# Patient Record
Sex: Female | Born: 1970 | Race: Black or African American | Hispanic: No | Marital: Single | Smoking: Never smoker
Health system: Southern US, Community
[De-identification: ages and names within clinical notes are randomized; demographics above are authoritative.]

## PROBLEM LIST (undated history)

## (undated) DIAGNOSIS — J45909 Unspecified asthma, uncomplicated: Secondary | ICD-10-CM

## (undated) HISTORY — PX: BACK SURGERY: SHX140

## (undated) HISTORY — PX: NECK SURGERY: SHX720

---

## 2017-05-27 ENCOUNTER — Ambulatory Visit (HOSPITAL_COMMUNITY): Payer: Self-pay | Admitting: Psychiatry

## 2017-06-01 ENCOUNTER — Encounter (HOSPITAL_COMMUNITY): Payer: Self-pay | Admitting: Psychiatry

## 2017-06-01 ENCOUNTER — Encounter (INDEPENDENT_AMBULATORY_CARE_PROVIDER_SITE_OTHER): Payer: Self-pay

## 2017-06-01 ENCOUNTER — Ambulatory Visit (HOSPITAL_COMMUNITY): Payer: Self-pay | Admitting: Psychiatry

## 2017-06-01 VITALS — BP 122/74 | HR 81 | Ht 63.0 in | Wt 191.4 lb

## 2017-06-01 DIAGNOSIS — F331 Major depressive disorder, recurrent, moderate: Secondary | ICD-10-CM

## 2017-06-01 MED ORDER — DULOXETINE HCL 60 MG PO CPEP: 60.0000 mg | ORAL_CAPSULE | Freq: Every day | ORAL | 1 refills | Status: DC

## 2017-06-01 MED ORDER — TRAZODONE HCL 100 MG PO TABS: 100.0000 mg | ORAL_TABLET | Freq: Every day | ORAL | 0 refills | Status: DC

## 2017-06-01 MED ORDER — HYDROXYZINE PAMOATE 50 MG PO CAPS: 50.0000 mg | ORAL_CAPSULE | Freq: Three times a day (TID) | ORAL | 0 refills | Status: DC | PRN

## 2017-06-01 NOTE — Patient Instructions (Addendum)
We discussed that if she like to start Wellbutrin that may help her energy, concentration, attention and focus.
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
## 2017-06-13 ENCOUNTER — Other Ambulatory Visit (HOSPITAL_COMMUNITY): Payer: Self-pay | Admitting: Psychiatry

## 2017-06-16 ENCOUNTER — Ambulatory Visit (INDEPENDENT_AMBULATORY_CARE_PROVIDER_SITE_OTHER): Payer: Self-pay | Admitting: Psychiatry

## 2017-06-16 DIAGNOSIS — F331 Major depressive disorder, recurrent, moderate: Secondary | ICD-10-CM
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
## 2017-07-01 ENCOUNTER — Ambulatory Visit (HOSPITAL_COMMUNITY): Payer: Self-pay | Admitting: Psychiatry

## 2017-08-10 ENCOUNTER — Other Ambulatory Visit (HOSPITAL_COMMUNITY): Payer: Self-pay

## 2017-08-10 MED ORDER — DULOXETINE HCL 60 MG PO CPEP: 60.0000 mg | ORAL_CAPSULE | Freq: Every day | ORAL | 0 refills | Status: DC

## 2017-08-10 MED ORDER — HYDROXYZINE PAMOATE 50 MG PO CAPS: 50.0000 mg | ORAL_CAPSULE | Freq: Three times a day (TID) | ORAL | 0 refills | Status: DC | PRN

## 2017-08-10 MED ORDER — TRAZODONE HCL 100 MG PO TABS: 100.0000 mg | ORAL_TABLET | Freq: Every day | ORAL | 0 refills | Status: DC

## 2017-09-14 ENCOUNTER — Other Ambulatory Visit (HOSPITAL_COMMUNITY): Payer: Self-pay

## 2017-09-14 MED ORDER — HYDROXYZINE PAMOATE 50 MG PO CAPS: 50.0000 mg | ORAL_CAPSULE | Freq: Three times a day (TID) | ORAL | 0 refills | Status: DC | PRN

## 2017-09-14 MED ORDER — DULOXETINE HCL 60 MG PO CPEP: 60.0000 mg | ORAL_CAPSULE | Freq: Every day | ORAL | 0 refills | Status: DC

## 2017-09-14 MED ORDER — TRAZODONE HCL 100 MG PO TABS: 100.0000 mg | ORAL_TABLET | Freq: Every day | ORAL | 0 refills | Status: DC

## 2017-09-14 NOTE — Progress Notes (Signed)
Patient called for refills on medications, she had no future appointments scheduled. I made her an appointment and refilled the scripts for one month per Regan.

## 2017-10-06 ENCOUNTER — Ambulatory Visit (HOSPITAL_COMMUNITY): Payer: Self-pay | Admitting: Psychiatry

## 2017-10-12 ENCOUNTER — Other Ambulatory Visit (HOSPITAL_COMMUNITY): Payer: Self-pay

## 2017-10-12 MED ORDER — TRAZODONE HCL 100 MG PO TABS: 100.0000 mg | ORAL_TABLET | Freq: Every day | ORAL | 0 refills | Status: DC

## 2017-10-12 MED ORDER — DULOXETINE HCL 60 MG PO CPEP: 60.0000 mg | ORAL_CAPSULE | Freq: Every day | ORAL | 0 refills | Status: DC

## 2017-10-12 NOTE — Progress Notes (Signed)
Sent in 30 day refill, per Regan. Appointment is 10/25/17

## 2017-10-12 NOTE — Progress Notes (Signed)
Sent in 30 day refill, Per Regan. Appointment is 10/15/17

## 2017-10-15 ENCOUNTER — Ambulatory Visit (INDEPENDENT_AMBULATORY_CARE_PROVIDER_SITE_OTHER): Payer: Self-pay | Admitting: Psychiatry

## 2017-10-15 ENCOUNTER — Encounter (HOSPITAL_COMMUNITY): Payer: Self-pay | Admitting: Psychiatry

## 2017-10-15 VITALS — BP 132/74 | HR 78 | Ht 64.0 in | Wt 192.0 lb

## 2017-10-15 DIAGNOSIS — Z915 Personal history of self-harm: Secondary | ICD-10-CM

## 2017-10-15 DIAGNOSIS — G4700 Insomnia, unspecified: Secondary | ICD-10-CM

## 2017-10-15 DIAGNOSIS — M549 Dorsalgia, unspecified: Secondary | ICD-10-CM

## 2017-10-15 DIAGNOSIS — F419 Anxiety disorder, unspecified: Secondary | ICD-10-CM

## 2017-10-15 DIAGNOSIS — M2550 Pain in unspecified joint: Secondary | ICD-10-CM

## 2017-10-15 DIAGNOSIS — R450 Nervousness: Secondary | ICD-10-CM

## 2017-10-15 DIAGNOSIS — F331 Major depressive disorder, recurrent, moderate: Secondary | ICD-10-CM

## 2017-10-15 MED ORDER — CLONAZEPAM 0.5 MG PO TABS: 0.5000 mg | ORAL_TABLET | Freq: Every day | ORAL | 0 refills | Status: DC

## 2017-10-15 MED ORDER — HYDROXYZINE PAMOATE 50 MG PO CAPS: 50.0000 mg | ORAL_CAPSULE | Freq: Every day | ORAL | 0 refills | Status: DC | PRN

## 2017-10-15 MED ORDER — BUPROPION HCL ER (XL) 150 MG PO TB24: 150.0000 mg | ORAL_TABLET | Freq: Every day | ORAL | 0 refills | Status: DC

## 2017-10-15 MED ORDER — HYDROXYZINE PAMOATE 50 MG PO CAPS
50.0000 mg | ORAL_CAPSULE | Freq: Every day | ORAL | 0 refills | Status: DC | PRN
Start: 2017-10-15 — End: 2018-10-05

## 2017-10-15 MED ORDER — BUPROPION HCL ER (XL) 150 MG PO TB24
150.0000 mg | ORAL_TABLET | Freq: Every day | ORAL | 0 refills | Status: DC
Start: 2017-10-15 — End: 2017-11-04

## 2017-10-15 NOTE — Progress Notes (Signed)
BH MD/PA/NP OP Progress Note  10/15/2017 3:36 PM Kimberly Harmon  MRN:  [id number]  Chief Complaint: I ran out from Cymbalta 2 days ago.  I want to try a different medication.  HPI: Patient came for her follow-up appointment.  She is a 47 year old divorced unemployed female who was seen first time 3 months ago.  Patient apologized missing her appointment.  She moved from [place] to live closer to her sister.  Patient was admitted in October 2018 in [place] due to severe depression and having suicidal thoughts.  She was discharged on Remeron and Cymbalta.  We have discontinue Remeron because patient was not sleeping good and we try trazodone.  Patient do not see any improvement with the trazodone and continued to have insomnia, racing thoughts and depressive symptoms.  She felt the Cymbalta not working.  She is been out of Cymbalta for 2 days and like to try a different medication.  She lives with her sister but she admitted she need to find a separate place for her because some time environment is not good.  Patient told her sister fights with her husband a lot and that does not help her.  Patient is a 47 year old daughter who lives in [place].  Patient is noncompliant with therapist.  Patient admitted chronic feeling of unhappiness, guilt, regret, fatigue and sadness.  Sometimes she has difficulty focus and attention.  She used to take Adderall which was given by primary care physician to help focus and attention but discontinued when she was admitted in the hospital.  Currently patient is not in the school or doing any work.  She admitted lack of motivation and desire to do things.  Her appetite is okay.  Her vital signs are stable.  Patient has back surgery and she has backache.  In the past she had tried Klonopin which helped her sleep.  Patient denies drinking alcohol or using any illegal substances.  Visit Diagnosis:    ICD-10-CM   1. MDD (major depressive disorder), recurrent episode, moderate  (HCC) F33.1 clonazePAM (KLONOPIN) 0.5 MG tablet    buPROPion (WELLBUTRIN XL) 150 MG 24 hr tablet    hydrOXYzine (VISTARIL) 50 MG capsule    DISCONTINUED: buPROPion (WELLBUTRIN XL) 150 MG 24 hr tablet    DISCONTINUED: hydrOXYzine (VISTARIL) 50 MG capsule    Past Psychiatric History: Reviewed Patient has a history of depression and sadness most of her life.  She remember taking extra pill at age 47 and then age 47 when she was not happy.  Patient was [place] which was discontinued when she was admitted at [facility] in October 2018.  Patient had tried Cymbalta, Remeron when she discharged from the hospital.  We try trazodone which did not work.  Patient denies any history of mania, psychosis or any hallucination.  Past Medical History: No past medical history on file.  Past Surgical History:  Procedure Laterality Date  . BACK SURGERY    . NECK SURGERY      Family Psychiatric History: Reviewed  Family History:  Family History  Problem Relation Age of Onset  . Depression Sister     Social History:  Social History   Socioeconomic History  . Marital status: Single    Spouse name: Not on file  . Number of children: Not on file  . Years of education: Not on file  . Highest education level: Not on file  Occupational History  . Not on file  Social Needs  . Financial resource strain: Not on file  . Food insecurity:    Worry: Not on file    Inability: Not on file  . Transportation needs:    Medical: Not on file    Non-medical: Not on file  Tobacco Use  . Smoking status: Never Smoker  . Smokeless tobacco: Never Used  Substance and Sexual Activity  . Alcohol use: No    Frequency: Never  . Drug use: No  . Sexual activity: Not on file  Lifestyle  . Physical activity:    Days per week: Not on file    Minutes per session: Not on file  . Stress: Not on file  Relationships  . Social connections:    Talks on  phone: Not on file    Gets together: Not on file    Attends religious service: Not on file    Active member of club or organization: Not on file    Attends meetings of clubs or organizations: Not on file    Relationship status: Not on file  Other Topics Concern  . Not on file  Social History Narrative  . Not on file    Allergies: Not on File  Metabolic Disorder Labs: No results found for: HGBA1C, MPG No results found for: PROLACTIN No results found for: CHOL, TRIG, HDL, CHOLHDL, VLDL, LDLCALC No results found for: TSH  Therapeutic Level Labs: No results found for: LITHIUM No results found for: VALPROATE No components found for:  CBMZ  Current Medications: Current Outpatient Medications  Medication Sig Dispense Refill  . DULoxetine (CYMBALTA) 60 MG capsule Take 1 capsule (60 mg total) by mouth daily. 30 capsule 0  . hydrOXYzine (VISTARIL) 50 MG capsule Take 1 capsule (50 mg total) by mouth 3 (three) times daily as needed. 30 capsule 0  . methocarbamol (ROBAXIN) 500 MG tablet Take 1,000 mg daily as needed by mouth.    . traMADol (ULTRAM) 50 MG tablet Take every 6 (six) hours as needed by mouth.    . traZODone (DESYREL) 100 MG tablet Take 1 tablet (100 mg total) by mouth at bedtime. 30 tablet 0   No current facility-administered medications for this visit.      Musculoskeletal: Strength & Muscle Tone: within normal limits Gait & Station: normal Patient leans: N/A  Psychiatric Specialty Exam: Review of Systems  Constitutional: Negative.   Musculoskeletal: Positive for back pain and joint pain.  Skin: Negative.   Psychiatric/Behavioral: Positive for depression. The patient is nervous/anxious and has insomnia.     Blood pressure 132/74, pulse 78, height 5\' 4"  (1.626 m), weight 192 lb (87.1 kg).There is no height or weight on file to calculate BMI.  General Appearance: Casual  Eye Contact:  Fair  Speech:  Slow  Volume:  Normal  Mood:  Anxious  Affect:  Congruent   Thought Process:  Goal Directed  Orientation:  Full (Time, Place, and Person)  Thought Content: Rumination   Suicidal Thoughts:  No  Homicidal Thoughts:  No  Memory:  Immediate;   Good Recent;   Good Remote;   Good  Judgement:  Good  Insight:  Good  Psychomotor Activity:  Decreased  Concentration:  Concentration: Fair and Attention Span: Fair  Recall:  Good  Fund of Knowledge: Good  Language: Good  Akathisia:  No  Handed:  Right  AIMS (if indicated): not done  Assets:  Communication Skills Desire for Improvement Housing Resilience Social Support  ADL's:  Intact  Cognition: WNL  Sleep:  Fair   Screenings:   Assessment and Plan: Major depressive disorder, recurrent.  Anxiety disorder NOS.  We are still awaiting records from [place].  I will discontinue Cymbalta as patient do not see any improvement.  I will also discontinue trazodone because patient continues to have insomnia.  Recommended to try Wellbutrin XL 150 mg to help focus attention and depression and anxiety symptoms.  I would also add low-dose Klonopin to help sleep and anxiety symptoms.  Discussed benzodiazepine dependence, tolerance and withdrawal.  Patient is not interested in counseling.  She is hoping to move back to [place] because she does not like here.  Discussed safety concerns at any time having active suicidal thoughts or homicidal thought and she need to call 911 or go to local emergency room.  Follow-up in 4 weeks.  Time spent 25 minutes.  More than 50% of the time spent in psychoeducation, counseling and coordination of care.   Cleotis Nipper, MD 10/15/2017, 3:36 PM

## 2017-10-21 ENCOUNTER — Telehealth (HOSPITAL_COMMUNITY): Payer: Self-pay

## 2017-10-21 NOTE — Telephone Encounter (Signed)
Patient is calling to let you know that she is doing great on the Wellbutrin, she has a ton of energy and is feeling great. She said that now she is having trouble going to sleep. She loves the energy, but wants to know if you can increase the Klonopin to 1 mg at bedtime. Please review and advise, thank you

## 2017-10-21 NOTE — Telephone Encounter (Signed)
She should try taking melatonin first.  She already taking Vistaril and Klonopin for insomnia.

## 2017-10-22 NOTE — Telephone Encounter (Signed)
I spoke to the patient and she is willing to try the melatonin. She will call me next week and let me know how she is doing

## 2017-11-04 ENCOUNTER — Other Ambulatory Visit (HOSPITAL_COMMUNITY): Payer: Self-pay

## 2017-11-04 DIAGNOSIS — F331 Major depressive disorder, recurrent, moderate: Secondary | ICD-10-CM

## 2017-11-04 MED ORDER — CLONAZEPAM 0.5 MG PO TABS: 0.5000 mg | ORAL_TABLET | Freq: Every day | ORAL | 0 refills | Status: DC

## 2017-11-04 MED ORDER — BUPROPION HCL ER (XL) 150 MG PO TB24: 150.0000 mg | ORAL_TABLET | Freq: Every day | ORAL | 0 refills | Status: DC

## 2017-12-15 ENCOUNTER — Ambulatory Visit (HOSPITAL_COMMUNITY): Payer: Self-pay | Admitting: Psychiatry

## 2018-01-25 ENCOUNTER — Other Ambulatory Visit: Payer: Self-pay

## 2018-01-25 ENCOUNTER — Emergency Department (HOSPITAL_BASED_OUTPATIENT_CLINIC_OR_DEPARTMENT_OTHER)
Admission: EM | Admit: 2018-01-25 | Discharge: 2018-01-25 | Disposition: A | Discharge status: Home / Self Care | Payer: Self-pay | Attending: Emergency Medicine | Admitting: Emergency Medicine

## 2018-01-25 ENCOUNTER — Encounter (HOSPITAL_BASED_OUTPATIENT_CLINIC_OR_DEPARTMENT_OTHER): Payer: Self-pay | Admitting: *Deleted

## 2018-01-25 DIAGNOSIS — Z79899 Other long term (current) drug therapy: Secondary | ICD-10-CM | POA: Insufficient documentation

## 2018-01-25 DIAGNOSIS — J45901 Unspecified asthma with (acute) exacerbation: Secondary | ICD-10-CM

## 2018-01-25 DIAGNOSIS — J4541 Moderate persistent asthma with (acute) exacerbation: Secondary | ICD-10-CM | POA: Insufficient documentation

## 2018-01-25 MED ORDER — ALBUTEROL SULFATE HFA 108 (90 BASE) MCG/ACT IN AERS
2.0000 | INHALATION_SPRAY | Freq: Once | RESPIRATORY_TRACT | Status: AC
  Administered 2018-01-25: 2 via RESPIRATORY_TRACT
  Filled 2018-01-25: qty 6.7

## 2018-01-25 MED ORDER — ALBUTEROL SULFATE (2.5 MG/3ML) 0.083% IN NEBU
2.5000 mg | INHALATION_SOLUTION | Freq: Once | RESPIRATORY_TRACT | Status: AC
  Administered 2018-01-25: 2.5 mg via RESPIRATORY_TRACT
  Filled 2018-01-25: qty 3

## 2018-01-25 MED ORDER — ALBUTEROL SULFATE (2.5 MG/3ML) 0.083% IN NEBU: 2.5000 mg | INHALATION_SOLUTION | RESPIRATORY_TRACT | 12 refills | Status: DC | PRN

## 2018-01-25 MED ORDER — IPRATROPIUM-ALBUTEROL 0.5-2.5 (3) MG/3ML IN SOLN
3.0000 mL | Freq: Once | RESPIRATORY_TRACT | Status: AC
  Administered 2018-01-25: 3 mL via RESPIRATORY_TRACT
  Filled 2018-01-25: qty 3

## 2018-01-25 MED ORDER — ALBUTEROL SULFATE HFA 108 (90 BASE) MCG/ACT IN AERS: 1.0000 | INHALATION_SPRAY | RESPIRATORY_TRACT | 2 refills | Status: AC | PRN

## 2018-01-25 MED ORDER — ALBUTEROL SULFATE (2.5 MG/3ML) 0.083% IN NEBU
5.0000 mg | INHALATION_SOLUTION | Freq: Once | RESPIRATORY_TRACT | Status: AC
  Administered 2018-01-25: 5 mg via RESPIRATORY_TRACT
  Filled 2018-01-25: qty 6

## 2018-01-25 MED ORDER — PREDNISONE 20 MG PO TABS: 40.0000 mg | ORAL_TABLET | Freq: Every day | ORAL | 0 refills | Status: DC

## 2018-01-25 MED ORDER — PREDNISONE 20 MG PO TABS
40.0000 mg | ORAL_TABLET | Freq: Once | ORAL | Status: AC
  Administered 2018-01-25: 40 mg via ORAL
  Filled 2018-01-25: qty 2

## 2018-01-25 NOTE — ED Triage Notes (Signed)
Cough x 3 weeks. Hx of asthma. She has been using her inhaler and taking OTC cough meds with no relief. She feels like she has a sinus infection.

## 2018-01-26 ENCOUNTER — Telehealth (HOSPITAL_COMMUNITY): Payer: Self-pay

## 2018-01-26 ENCOUNTER — Other Ambulatory Visit (HOSPITAL_COMMUNITY): Payer: Self-pay | Admitting: Psychiatry

## 2018-01-26 DIAGNOSIS — F331 Major depressive disorder, recurrent, moderate: Secondary | ICD-10-CM

## 2018-01-26 NOTE — Telephone Encounter (Signed)
Thanks

## 2018-01-26 NOTE — Telephone Encounter (Signed)
Patient is requesting a refill on Wellbutrin 150mg  and Clonazepam 0.5mg . Pharmacy is [facility] [address]. Please advise

## 2018-01-26 NOTE — Telephone Encounter (Signed)
She was given a 90-day supply both medication on April 10.  She has appointment on July 9.

## 2018-02-01 ENCOUNTER — Other Ambulatory Visit (HOSPITAL_COMMUNITY): Payer: Self-pay | Admitting: Psychiatry

## 2018-02-01 DIAGNOSIS — F331 Major depressive disorder, recurrent, moderate: Secondary | ICD-10-CM

## 2018-02-01 NOTE — ED Provider Notes (Signed)
[facility] EMERGENCY DEPARTMENT Provider Note   CSN: [id number] Arrival date & time: 01/25/18  1828     History   Chief Complaint Chief Complaint  Patient presents with  . Cough    HPI Kimberly Harmon is a 47 y.o. female.  HPI   [name] with cough and wheezing. Persistent for past 3 w. No fever. Cough nonproductive. Hx of asthma and feels similar to prior exacerbations. Also congestion and mild facial pressure. She feels like she may have sinusitis. No unusual leg pain or swelling.   History reviewed. No pertinent past medical history.  There are no active problems to display for this patient.   Past Surgical History:  Procedure Laterality Date  . BACK SURGERY    . NECK SURGERY       OB History   None      Home Medications    Prior to Admission medications   Medication Sig Start Date End Date Taking? Authorizing Provider  buPROPion (WELLBUTRIN XL) 150 MG 24 hr tablet Take 1 tablet (150 mg total) by mouth daily. 11/04/17  Yes Arfeen, Phillips Grout, MD  clonazePAM (KLONOPIN) 0.5 MG tablet Take 1 tablet (0.5 mg total) by mouth at bedtime. 11/04/17 11/04/18 Yes Arfeen, Phillips Grout, MD  albuterol (PROVENTIL HFA;VENTOLIN HFA) 108 (90 Base) MCG/ACT inhaler Inhale 1-2 puffs into the lungs every 4 (four) hours as needed for wheezing or shortness of breath. 01/25/18   Raeford Razor, MD  albuterol (PROVENTIL) (2.5 MG/3ML) 0.083% nebulizer solution Take 3 mLs (2.5 mg total) by nebulization every 4 (four) hours as needed for wheezing or shortness of breath. 01/25/18   Raeford Razor, MD  hydrOXYzine (VISTARIL) 50 MG capsule Take 1 capsule (50 mg total) by mouth daily as needed for anxiety. 10/15/17   Arfeen, Phillips Grout, MD  methocarbamol (ROBAXIN) 500 MG tablet Take 1,000 mg daily as needed by mouth.    [provider]  predniSONE (DELTASONE) 20 MG tablet Take 2 tablets (40 mg total) by mouth daily. 01/25/18   Raeford Razor, MD  traMADol (ULTRAM) 50 MG tablet Take every 6 (six) hours as  needed by mouth.    [provider]    Family History Family History  Problem Relation Age of Onset  . Depression Sister     Social History Social History   Tobacco Use  . Smoking status: Never Smoker  . Smokeless tobacco: Never Used  Substance Use Topics  . Alcohol use: No    Frequency: Never  . Drug use: No     Allergies   Patient has no known allergies.   Review of Systems Review of Systems  All systems reviewed and negative, other than as noted in HPI.   Physical Exam Updated Vital Signs BP (!) 136/96   Pulse 89   Temp 97.9 F (36.6 C) (Oral)   Resp 18   Ht 5\' 3"  (1.6 m)   Wt 86.2 kg (190 lb)   LMP 01/25/2018   SpO2 96%   BMI 33.66 kg/m   Physical Exam  Constitutional: She appears well-developed and well-nourished. No distress.  HENT:  Head: Normocephalic and atraumatic.  Eyes: Conjunctivae are normal. Right eye exhibits no discharge. Left eye exhibits no discharge.  Neck: Neck supple.  Cardiovascular: Normal rate, regular rhythm and normal heart sounds. Exam reveals no gallop and no friction rub.  No murmur heard. Pulmonary/Chest: Effort normal. No respiratory distress. She has wheezes.  Abdominal: Soft. She exhibits no distension. There is no tenderness.  Musculoskeletal: She exhibits no edema or tenderness.  Lower extremities symmetric as compared to each other. No calf tenderness. Negative Homan's. No palpable cords.   Neurological: She is alert.  Skin: Skin is warm and dry.  Psychiatric: She has a normal mood and affect. Her behavior is normal. Thought content normal.  Nursing note and vitals reviewed.    ED Treatments / Results  Labs (all labs ordered are listed, but only abnormal results are displayed) Labs Reviewed - No data to display  EKG None  Radiology No results found.   Procedures Procedures (including critical care time)  Medications Ordered in ED Medications  ipratropium-albuterol (DUONEB) 0.5-2.5 (3)  MG/3ML nebulizer solution 3 mL (3 mLs Nebulization Given 01/25/18 1852)  albuterol (PROVENTIL) (2.5 MG/3ML) 0.083% nebulizer solution 2.5 mg (2.5 mg Nebulization Given 01/25/18 1852)  predniSONE (DELTASONE) tablet 40 mg (40 mg Oral Given 01/25/18 1858)  albuterol (PROVENTIL) (2.5 MG/3ML) 0.083% nebulizer solution 5 mg (5 mg Nebulization Given 01/25/18 1919)  albuterol (PROVENTIL HFA;VENTOLIN HFA) 108 (90 Base) MCG/ACT inhaler 2 puff (2 puffs Inhalation Given 01/25/18 1946)     Initial Impression / Assessment and Plan / ED Course  I have reviewed the triage vital signs and the nursing notes.  Pertinent labs & imaging results that were available during my care of the patient were reviewed by me and considered in my medical decision making (see chart for details).     [name] with cough/dyspnea. Wheezing on exam. Afebrile. Nontoxic. o2 sats adequate on RA. Seems reasonably comfortable. Feeling better after nebs. I doubt pneumonia, PE or other emergent process. Plan steroids. Refills for albuterol provided.   Final Clinical Impressions(s) / ED Diagnoses   Final diagnoses:  Moderate asthma with exacerbation, unspecified whether persistent    ED Discharge Orders        Ordered    albuterol (PROVENTIL HFA;VENTOLIN HFA) 108 (90 Base) MCG/ACT inhaler  Every 4 hours PRN     01/25/18 1938    albuterol (PROVENTIL) (2.5 MG/3ML) 0.083% nebulizer solution  Every 4 hours PRN     01/25/18 1938    predniSONE (DELTASONE) 20 MG tablet  Daily     01/25/18 1938       Raeford RazorKohut, Keyvon Herter, MD 02/01/18 [phone]

## 2018-02-02 ENCOUNTER — Other Ambulatory Visit (HOSPITAL_COMMUNITY): Payer: Self-pay

## 2018-02-02 ENCOUNTER — Ambulatory Visit (HOSPITAL_COMMUNITY): Payer: Self-pay | Admitting: Psychiatry

## 2018-02-02 DIAGNOSIS — F331 Major depressive disorder, recurrent, moderate: Secondary | ICD-10-CM

## 2018-02-02 MED ORDER — BUPROPION HCL ER (XL) 150 MG PO TB24: 150.0000 mg | ORAL_TABLET | Freq: Every day | ORAL | 0 refills | Status: DC

## 2018-02-02 MED ORDER — CLONAZEPAM 0.5 MG PO TABS: 0.5000 mg | ORAL_TABLET | Freq: Every day | ORAL | 0 refills | Status: DC

## 2018-04-16 ENCOUNTER — Other Ambulatory Visit: Payer: Self-pay

## 2018-04-16 ENCOUNTER — Emergency Department (HOSPITAL_BASED_OUTPATIENT_CLINIC_OR_DEPARTMENT_OTHER)
Admission: EM | Admit: 2018-04-16 | Discharge: 2018-04-16 | Disposition: A | Discharge status: Home / Self Care | Payer: Self-pay | Attending: Emergency Medicine | Admitting: Emergency Medicine

## 2018-04-16 ENCOUNTER — Emergency Department (HOSPITAL_BASED_OUTPATIENT_CLINIC_OR_DEPARTMENT_OTHER): Payer: Self-pay

## 2018-04-16 ENCOUNTER — Encounter (HOSPITAL_BASED_OUTPATIENT_CLINIC_OR_DEPARTMENT_OTHER): Payer: Self-pay

## 2018-04-16 DIAGNOSIS — J0100 Acute maxillary sinusitis, unspecified: Secondary | ICD-10-CM | POA: Insufficient documentation

## 2018-04-16 DIAGNOSIS — J90 Pleural effusion, not elsewhere classified: Secondary | ICD-10-CM | POA: Insufficient documentation

## 2018-04-16 DIAGNOSIS — Z79899 Other long term (current) drug therapy: Secondary | ICD-10-CM | POA: Insufficient documentation

## 2018-04-16 DIAGNOSIS — J4521 Mild intermittent asthma with (acute) exacerbation: Secondary | ICD-10-CM | POA: Insufficient documentation

## 2018-04-16 HISTORY — DX: Unspecified asthma, uncomplicated: J45.909

## 2018-04-16 MED ORDER — GUAIFENESIN ER 1200 MG PO TB12: 1.0000 | ORAL_TABLET | Freq: Two times a day (BID) | ORAL | 1 refills | Status: AC | PRN

## 2018-04-16 MED ORDER — GUAIFENESIN ER 1200 MG PO TB12: 1.0000 | ORAL_TABLET | Freq: Two times a day (BID) | ORAL | 0 refills | Status: AC | PRN

## 2018-04-16 MED ORDER — BENZONATATE 100 MG PO CAPS
100.0000 mg | ORAL_CAPSULE | Freq: Once | ORAL | Status: AC
  Administered 2018-04-16: 100 mg via ORAL
  Filled 2018-04-16: qty 1

## 2018-04-16 MED ORDER — ALBUTEROL SULFATE (2.5 MG/3ML) 0.083% IN NEBU
2.5000 mg | INHALATION_SOLUTION | Freq: Once | RESPIRATORY_TRACT | Status: AC
  Administered 2018-04-16: 2.5 mg via RESPIRATORY_TRACT

## 2018-04-16 MED ORDER — DOXYCYCLINE HYCLATE 100 MG PO CAPS: 100.0000 mg | ORAL_CAPSULE | Freq: Two times a day (BID) | ORAL | 0 refills | Status: AC

## 2018-04-16 MED ORDER — PREDNISONE 10 MG PO TABS: 40.0000 mg | ORAL_TABLET | Freq: Every day | ORAL | 0 refills | Status: DC

## 2018-04-16 MED ORDER — PREDNISONE 10 MG PO TABS
60.0000 mg | ORAL_TABLET | Freq: Once | ORAL | Status: AC
  Administered 2018-04-16: 60 mg via ORAL
  Filled 2018-04-16: qty 1

## 2018-04-16 MED ORDER — IPRATROPIUM BROMIDE 0.02 % IN SOLN
0.5000 mg | Freq: Once | RESPIRATORY_TRACT | Status: AC
  Administered 2018-04-16: 0.5 mg via RESPIRATORY_TRACT
  Filled 2018-04-16: qty 2.5

## 2018-04-16 MED ORDER — ALBUTEROL SULFATE HFA 108 (90 BASE) MCG/ACT IN AERS
1.0000 | INHALATION_SPRAY | Freq: Once | RESPIRATORY_TRACT | Status: AC
  Administered 2018-04-16: 1 via RESPIRATORY_TRACT
  Filled 2018-04-16: qty 6.7

## 2018-04-16 MED ORDER — DOXYCYCLINE HYCLATE 100 MG PO TABS
100.0000 mg | ORAL_TABLET | Freq: Once | ORAL | Status: AC
  Administered 2018-04-16: 100 mg via ORAL
  Filled 2018-04-16: qty 1

## 2018-04-16 MED ORDER — ALBUTEROL (5 MG/ML) CONTINUOUS INHALATION SOLN
10.0000 mg/h | INHALATION_SOLUTION | RESPIRATORY_TRACT | Status: DC
  Administered 2018-04-16: 10 mg/h via RESPIRATORY_TRACT
  Filled 2018-04-16: qty 20

## 2018-04-16 MED ORDER — BENZONATATE 100 MG PO CAPS: 100.0000 mg | ORAL_CAPSULE | Freq: Three times a day (TID) | ORAL | 0 refills | Status: DC

## 2018-04-16 MED ORDER — IPRATROPIUM-ALBUTEROL 0.5-2.5 (3) MG/3ML IN SOLN
3.0000 mL | Freq: Once | RESPIRATORY_TRACT | Status: AC
  Administered 2018-04-16: 3 mL via RESPIRATORY_TRACT

## 2018-04-16 NOTE — ED Notes (Signed)
ED Provider at bedside. 

## 2018-04-16 NOTE — ED Provider Notes (Signed)
Medical screening examination/treatment/procedure(s) were conducted as a shared visit with non-physician practitioner(s) and myself.  I personally evaluated the patient during the encounter.  47 year old female history of asthma the presents with a week and half of progressively worsening shortness of breath, postnasal drip, left sinus pain.  Last couple days got significantly worse to the point where she felt like she could barely breathing her inhaler was now but also she came here for further evaluation.  On my evaluation patient is Artie had quite a few breathing treatments and prednisone and she states she feels much better.  She still diminished with expiratory wheezing but she appears comfortable.  She is slightly tachypneic but not in any respiratory distress.  Patient does have a little bit left maxillary sinus tenderness to palpation and some posterior oropharyngeal erythema.  Her x-ray shows a mild left pleural effusion.  Suspect she has a commute acquired pneumonia or signs of infection or both.  Will treat for that while also treating for asthma exacerbation.  None     Shaili Donalson, Barbara CowerJason, MD 04/16/18 2139

## 2018-04-16 NOTE — ED Notes (Signed)
Pt. Reports she has been congested for a week with cough and sinus pressure.

## 2018-04-16 NOTE — Discharge Instructions (Addendum)
You were seen here today for an asthma excaberation.   You received prednisone as well as a albuterol inhaler in the department.  Please take all of your antibiotics until finished!   You may develop abdominal discomfort or diarrhea from the antibiotic.  You may help offset this with probiotics which you can buy or get in yogurt. Do not eat or take the probiotics until 2 hours after your antibiotic. Do not take your medicine if develop an itchy rash, swelling in your mouth or lips, or difficulty breathing.   Albuterol inhaler - this medication will help open up your airway. Use inhaler as follows: 1-2 puffs with spacer every 4 hours as needed for wheezing, cough, or shortness of breath.   Prednisone - This is an oral steroid.  This medication is best taken with food in the morning.  Please note that this medication can cause anxiety, mood swings, muscle fatigue, increased hunger, weight gain (sodium/fluid retention), poor sleep as well as other symptoms. If you are a diabetic, please monitor your blood sugars at home as this medication can increase your blood sugars.   Please follow up with your primary care in 1 week to discuss if you need changes in your asthma medication regimen and to have a repeat xray to ensure your pleural effusion has resolved.   If you develop worsening or new concerning symptoms you can return to the emergency department for re-evaluation.

## 2018-04-16 NOTE — ED Provider Notes (Signed)
[facility] EMERGENCY DEPARTMENT Provider Note   CSN: [id number] Arrival date & time: 04/16/18  1606     History   Chief Complaint Chief Complaint  Patient presents with  . Shortness of Breath    HPI Kimberly Harmon is a 47 y.o. female with a history of asthma who presents emergency department today for cough and shortness of breath.  Patient reports that approximately 10 days ago she started developing nasal congestion, rhinorrhea, sinus pressure, postnasal drip, sore throat as well as a productive cough with clear sputum.  She notes that this is consistent with her allergies.  She has been trying Flonase, Zyrtec, honey tea for this without any relief.  She reports shortly after she started having an asthma exacerbation.  She states she typically gets asthma exacerbations after her allergies begin to flare.  She reports that she has shortness of breath and chest tightness with this.  She is been trying her home nebulizer treatments with mild relief but her symptoms returned shortly after.  Patient denies any associated fever, chest pain, hemoptysis, exogenous estrogen use, lower extremity swelling, recent surgery, recent travel, recent immobilization, history of cancer, history of blood clot. She has never been admitted or intubated because of her asthma.   HPI  Past Medical History:  Diagnosis Date  . Asthma     There are no active problems to display for this patient.   Past Surgical History:  Procedure Laterality Date  . BACK SURGERY    . NECK SURGERY       OB History   None      Home Medications    Prior to Admission medications   Medication Sig Start Date End Date Taking? Authorizing Provider  albuterol (PROVENTIL HFA;VENTOLIN HFA) 108 (90 Base) MCG/ACT inhaler Inhale 1-2 puffs into the lungs every 4 (four) hours as needed for wheezing or shortness of breath. 01/25/18   Raeford Razor, MD  albuterol (PROVENTIL) (2.5 MG/3ML) 0.083% nebulizer solution Take 3  mLs (2.5 mg total) by nebulization every 4 (four) hours as needed for wheezing or shortness of breath. 01/25/18   Raeford Razor, MD  buPROPion (WELLBUTRIN XL) 150 MG 24 hr tablet Take 1 tablet (150 mg total) by mouth daily. 02/02/18   Arfeen, Phillips Grout, MD  clonazePAM (KLONOPIN) 0.5 MG tablet Take 1 tablet (0.5 mg total) by mouth at bedtime. 02/02/18 02/02/19  Arfeen, Phillips Grout, MD  hydrOXYzine (VISTARIL) 50 MG capsule Take 1 capsule (50 mg total) by mouth daily as needed for anxiety. 10/15/17   Arfeen, Phillips Grout, MD  methocarbamol (ROBAXIN) 500 MG tablet Take 1,000 mg daily as needed by mouth.    [provider]  predniSONE (DELTASONE) 20 MG tablet Take 2 tablets (40 mg total) by mouth daily. 01/25/18   Raeford Razor, MD  traMADol (ULTRAM) 50 MG tablet Take every 6 (six) hours as needed by mouth.    [provider]    Family History Family History  Problem Relation Age of Onset  . Depression Sister     Social History Social History   Tobacco Use  . Smoking status: Never Smoker  . Smokeless tobacco: Never Used  Substance Use Topics  . Alcohol use: No    Frequency: Never  . Drug use: No     Allergies   Patient has no known allergies.   Review of Systems Review of Systems  All other systems reviewed and are negative.    Physical Exam Updated Vital Signs BP 130/85 (BP  Location: Right Arm)   Pulse 88   Temp 98.2 F (36.8 C) (Oral)   Resp 20   Ht 5\' 3"  (1.6 m)   Wt 86.2 kg   LMP 04/13/2018   SpO2 96%   BMI 33.66 kg/m   Physical Exam  Constitutional: She appears well-developed and well-nourished.  HENT:  Head: Normocephalic and atraumatic.  Right Ear: Tympanic membrane and external ear normal.  Left Ear: Tympanic membrane and external ear normal.  Nose: Mucosal edema and rhinorrhea present. Right sinus exhibits maxillary sinus tenderness. Right sinus exhibits no frontal sinus tenderness. Left sinus exhibits maxillary sinus tenderness. Left sinus exhibits no frontal  sinus tenderness.  Mouth/Throat: Uvula is midline, oropharynx is clear and moist and mucous membranes are normal. No tonsillar exudate.  The patient has normal phonation and is in control of secretions. No stridor.  Midline uvula without edema. Soft palate rises symmetrically.  No tonsillar erythema or exudates. No PTA. Cobblestoning. Tongue protrusion is normal. No trismus. No creptius on neck palpation and patient has good dentition. No gingival erythema or fluctuance noted. Mucus membranes moist.   Eyes: Pupils are equal, round, and reactive to light. Right eye exhibits no discharge. Left eye exhibits no discharge. No scleral icterus.  Neck: Trachea normal. Neck supple. No spinous process tenderness present. No neck rigidity. Normal range of motion present.  Cardiovascular: Normal rate, regular rhythm and intact distal pulses.  No murmur heard. Pulses:      Radial pulses are 2+ on the right side, and 2+ on the left side.       Dorsalis pedis pulses are 2+ on the right side, and 2+ on the left side.       Posterior tibial pulses are 2+ on the right side, and 2+ on the left side.  No lower extremity swelling or edema. Calves symmetric in size bilaterally.  Pulmonary/Chest: Effort normal. No tachypnea. No respiratory distress. She has decreased breath sounds. She has wheezes (global, expiratory). She exhibits no tenderness.  No increased work of breathing. No accessory muscle use. Patient is sitting upright, speaking in full sentences without difficulty   Abdominal: Soft. Bowel sounds are normal. There is no tenderness. There is no rebound and no guarding.  Musculoskeletal: She exhibits no edema.  Lymphadenopathy:    She has no cervical adenopathy.  Neurological: She is alert.  Skin: Skin is warm and dry. No rash noted. She is not diaphoretic.  Psychiatric: She has a normal mood and affect.  Nursing note and vitals reviewed.    ED Treatments / Results  Labs (all labs ordered are listed,  but only abnormal results are displayed) Labs Reviewed - No data to display  EKG None  Radiology Dg Chest 2 View  Result Date: 04/16/2018 CLINICAL DATA:  Shortness of breath with cough EXAM: CHEST - 2 VIEW COMPARISON:  None. FINDINGS: Tiny left pleural effusion. No focal consolidation. Normal heart size. No pneumothorax. IMPRESSION: Trace left pleural effusion. Electronically Signed   By: Jasmine Pang M.D.   On: 04/16/2018 17:06    Procedures Procedures (including critical care time) No critical care  Medications Ordered in ED Medications  albuterol (PROVENTIL,VENTOLIN) solution continuous neb (has no administration in time range)  ipratropium (ATROVENT) nebulizer solution 0.5 mg (has no administration in time range)  predniSONE (DELTASONE) tablet 60 mg (has no administration in time range)  benzonatate (TESSALON) capsule 100 mg (has no administration in time range)  ipratropium-albuterol (DUONEB) 0.5-2.5 (3) MG/3ML nebulizer solution 3 mL (3 mLs  Nebulization Given 04/16/18 1626)  albuterol (PROVENTIL) (2.5 MG/3ML) 0.083% nebulizer solution 2.5 mg (2.5 mg Nebulization Given 04/16/18 1626)     Initial Impression / Assessment and Plan / ED Course  I have reviewed the triage vital signs and the nursing notes.  Pertinent labs & imaging results that were available during my care of the patient were reviewed by me and considered in my medical decision making (see chart for details).     47 y.o. female with 10 days ago of nasal congestion, sinus pressure, rhinorrhea, postnasal drip, sore throat, productive cough, shortness of breath and chest tightness.  Patient is without hypoxia in the department.  Patient does have expiratory wheezing on exam.  She is in no respiratory distress.  Patient was given albuterol nebulizer prior to my evaluation.  Will give hour-long breathing treatment, prednisone.  Will check chest x-ray due to duration of patient's symptoms.  She is afebrile in the  department.    Patient found to have pleural effusion on the left.  Patient also noted to have likely sinusitis.  Will cover with doxycycline for both.  Possible pleural effusion is related to a pneumonia.  She will require follow-up x-ray with her primary care provider in 1 week to ensure resolution.    After breathing treatment patient reports her symptoms have now improved.  She is requesting to go home.  Lung sounds have improved. Patient ambulate in the department with oxygen saturations above 95%.  During ambulation patient's heart rate remained stable and she denies any shortness of breath.  She has been without hypoxia in the department.  Patient given albuterol inhaler and educated on how to use this with spacer in the department.  Patient given doxycycline, Tessalon, Mucinex for her symptoms.  First dose of doxycycline given in the department.  Discussed with patient she will need to follow with primary care provider in the next 1 week.  Return precautions discussed. Time was given for all questions to be answered. The patient verbalized understanding and agreement with plan. The patient appears safe for discharge home.  Patient case seen and discussed with Dr. Clayborne DanaMesner who is in agreement with plan.   Final Clinical Impressions(s) / ED Diagnoses   Final diagnoses:  Mild intermittent asthma with exacerbation  Acute non-recurrent maxillary sinusitis  Pleural effusion, left    ED Discharge Orders         Ordered    doxycycline (VIBRAMYCIN) 100 MG capsule  2 times daily     04/16/18 1846    benzonatate (TESSALON) 100 MG capsule  Every 8 hours     04/16/18 1846    Guaifenesin (MUCINEX MAXIMUM STRENGTH) 1200 MG TB12  Every 12 hours PRN     04/16/18 1846    predniSONE (DELTASONE) 10 MG tablet  Daily     04/16/18 1846           Princella PellegriniMaczis, Sheela Mcculley M, PA-C 04/16/18 1951    Mesner, Barbara CowerJason, MD 04/16/18 2140

## 2018-04-16 NOTE — ED Triage Notes (Signed)
10 days of increased SOB, hx of asthma, got worse two days ago, has tried nebs and inhaler without complete relief, used inhaler in lobby, last neb at 1300

## 2018-04-29 ENCOUNTER — Other Ambulatory Visit (HOSPITAL_COMMUNITY): Payer: Self-pay

## 2018-04-29 DIAGNOSIS — F331 Major depressive disorder, recurrent, moderate: Secondary | ICD-10-CM

## 2018-04-29 MED ORDER — BUPROPION HCL ER (XL) 150 MG PO TB24: 150.0000 mg | ORAL_TABLET | Freq: Every day | ORAL | 0 refills | Status: DC

## 2018-04-29 MED ORDER — CLONAZEPAM 0.5 MG PO TABS: 0.5000 mg | ORAL_TABLET | Freq: Every day | ORAL | 0 refills | Status: DC

## 2018-05-17 ENCOUNTER — Encounter (HOSPITAL_BASED_OUTPATIENT_CLINIC_OR_DEPARTMENT_OTHER): Payer: Self-pay | Admitting: *Deleted

## 2018-05-17 ENCOUNTER — Emergency Department (HOSPITAL_BASED_OUTPATIENT_CLINIC_OR_DEPARTMENT_OTHER)
Admission: EM | Admit: 2018-05-17 | Discharge: 2018-05-17 | Disposition: A | Discharge status: Home / Self Care | Payer: Self-pay | Attending: Emergency Medicine | Admitting: Emergency Medicine

## 2018-05-17 ENCOUNTER — Other Ambulatory Visit: Payer: Self-pay

## 2018-05-17 DIAGNOSIS — J4541 Moderate persistent asthma with (acute) exacerbation: Secondary | ICD-10-CM | POA: Insufficient documentation

## 2018-05-17 DIAGNOSIS — Z79899 Other long term (current) drug therapy: Secondary | ICD-10-CM | POA: Insufficient documentation

## 2018-05-17 MED ORDER — PREDNISONE 20 MG PO TABS: 40.0000 mg | ORAL_TABLET | Freq: Every day | ORAL | 0 refills | Status: DC

## 2018-05-17 MED ORDER — BENZONATATE 100 MG PO CAPS: 100.0000 mg | ORAL_CAPSULE | Freq: Three times a day (TID) | ORAL | 0 refills | Status: DC

## 2018-05-17 MED ORDER — ALBUTEROL (5 MG/ML) CONTINUOUS INHALATION SOLN
INHALATION_SOLUTION | RESPIRATORY_TRACT | Status: AC
  Administered 2018-05-17: 2 mg/h
  Filled 2018-05-17: qty 20

## 2018-05-17 MED ORDER — BENZONATATE 100 MG PO CAPS
100.0000 mg | ORAL_CAPSULE | Freq: Once | ORAL | Status: AC
  Administered 2018-05-17: 100 mg via ORAL
  Filled 2018-05-17: qty 1

## 2018-05-17 MED ORDER — PREDNISONE 20 MG PO TABS
40.0000 mg | ORAL_TABLET | Freq: Once | ORAL | Status: AC
  Administered 2018-05-17: 40 mg via ORAL
  Filled 2018-05-17: qty 2

## 2018-05-17 MED ORDER — ALBUTEROL SULFATE HFA 108 (90 BASE) MCG/ACT IN AERS
INHALATION_SPRAY | RESPIRATORY_TRACT | Status: AC
  Filled 2018-05-17: qty 6.7

## 2018-05-17 MED ORDER — ALBUTEROL SULFATE (2.5 MG/3ML) 0.083% IN NEBU: 2.5000 mg | INHALATION_SOLUTION | RESPIRATORY_TRACT | 0 refills | Status: DC | PRN

## 2018-05-17 MED ORDER — FLUTICASONE FUROATE-VILANTEROL 100-25 MCG/INH IN AEPB: 1.0000 | INHALATION_SPRAY | Freq: Every day | RESPIRATORY_TRACT | 0 refills | Status: AC

## 2018-05-17 MED ORDER — IPRATROPIUM-ALBUTEROL 0.5-2.5 (3) MG/3ML IN SOLN
RESPIRATORY_TRACT | Status: AC
  Administered 2018-05-17: 9 mL
  Filled 2018-05-17: qty 9

## 2018-05-17 NOTE — ED Triage Notes (Signed)
Chest congestion 5 days. SOB this am. She has asthma and ran out of her inhaler this am.

## 2018-05-17 NOTE — Discharge Instructions (Signed)
Please take the prednisone starting tomorrow for the next 4 days.  Take the Tessalon for cough.  Have represcribed your inhaler.  Take over-the-counter Mucinex to help with decongestant.  Drink plenty of fluids stay hydrated.  Make sure that you follow-up primary care doctor the next 1 to 2 weeks return the ED with any worsening symptoms or ongoing symptoms are not resolved.

## 2018-05-17 NOTE — ED Notes (Signed)
Pt. Is having resp. Treatment at this time.  Pt. Tolerating well.

## 2018-05-19 NOTE — ED Provider Notes (Signed)
[facility] EMERGENCY DEPARTMENT Provider Note   CSN: [id number] Arrival date & time: 05/17/18  1145     History   Chief Complaint Chief Complaint  Patient presents with  . Asthma    HPI Kimberly Harmon is a 47 y.o. female.  HPI 46 year old female past medical history significant for asthma that is poorly controlled on medication at home presents to the emergency department today for evaluation of cough, congestion, rhinorrhea, wheezing, chest tightness.  Patient states that her symptoms started approximately 1 week ago when she developed an upper respiratory tract infection.  She states that she does have a productive cough with clear sputum.  She states that her symptoms are consistent with her allergies and this will exacerbate her asthma.  She has been trying over-the-counter medication along with her albuterol inhaler and nebulizer with little relief.  Patient states that since moving to [place] she has had several asthma attacks.  She has not seen a primary care doctor in the area and does not have a refill of her Brio inhaler.  States that this helps her the most.  Patient reports ongoing symptoms despite albuterol inhaler.  She denies any fevers or chills.  She states that she is severely congested in her chest.  She denies any history of PE/DVT, prolonged immobilization, recent hospitalization/surgeries, unilateral leg swelling or calf tenderness, exogenous hormone use.  Patient does report continuing to smoke daily despite history of asthma.  Nothing makes her symptoms better or worse. No known sick contacts. Past Medical History:  Diagnosis Date  . Asthma     There are no active problems to display for this patient.   Past Surgical History:  Procedure Laterality Date  . BACK SURGERY    . NECK SURGERY       OB History   None      Home Medications    Prior to Admission medications   Medication Sig Start Date End Date Taking? Authorizing Provider    albuterol (PROVENTIL HFA;VENTOLIN HFA) 108 (90 Base) MCG/ACT inhaler Inhale 1-2 puffs into the lungs every 4 (four) hours as needed for wheezing or shortness of breath. 01/25/18   Raeford Razor, MD  albuterol (PROVENTIL) (2.5 MG/3ML) 0.083% nebulizer solution Take 3 mLs (2.5 mg total) by nebulization every 4 (four) hours as needed for wheezing or shortness of breath. 05/17/18   Rise Mu, PA-C  benzonatate (TESSALON) 100 MG capsule Take 1 capsule (100 mg total) by mouth every 8 (eight) hours. 05/17/18   Rise Mu, PA-C  buPROPion (WELLBUTRIN XL) 150 MG 24 hr tablet Take 1 tablet (150 mg total) by mouth daily. 04/29/18   Arfeen, Phillips Grout, MD  clonazePAM (KLONOPIN) 0.5 MG tablet Take 1 tablet (0.5 mg total) by mouth at bedtime. 04/29/18 04/29/19  Arfeen, Phillips Grout, MD  doxycycline (VIBRAMYCIN) 100 MG capsule Take 1 capsule (100 mg total) by mouth 2 (two) times daily. 04/16/18   Maczis, Elmer Sow, PA-C  doxycycline (VIBRAMYCIN) 100 MG capsule Take 1 capsule (100 mg total) by mouth 2 (two) times daily. 04/16/18   Maczis, Elmer Sow, PA-C  fluticasone furoate-vilanterol (BREO ELLIPTA) 100-25 MCG/INH AEPB Inhale 1 puff into the lungs daily. 05/17/18   Rise Mu, PA-C  Guaifenesin (MUCINEX MAXIMUM STRENGTH) 1200 MG TB12 Take 1 tablet (1,200 mg total) by mouth every 12 (twelve) hours as needed. 04/16/18   Maczis, Elmer Sow, PA-C  Guaifenesin (MUCINEX MAXIMUM STRENGTH) 1200 MG TB12 Take 1 tablet (1,200 mg total) by mouth  every 12 (twelve) hours as needed. 04/16/18   Maczis, Elmer Sow, PA-C  hydrOXYzine (VISTARIL) 50 MG capsule Take 1 capsule (50 mg total) by mouth daily as needed for anxiety. 10/15/17   Arfeen, Phillips Grout, MD  methocarbamol (ROBAXIN) 500 MG tablet Take 1,000 mg daily as needed by mouth.    [provider]  predniSONE (DELTASONE) 20 MG tablet Take 2 tablets (40 mg total) by mouth daily. 05/17/18   Rise Mu, PA-C  traMADol (ULTRAM) 50 MG tablet Take every 6 (six)  hours as needed by mouth.    [provider]    Family History Family History  Problem Relation Age of Onset  . Depression Sister     Social History Social History   Tobacco Use  . Smoking status: Never Smoker  . Smokeless tobacco: Never Used  Substance Use Topics  . Alcohol use: No    Frequency: Never  . Drug use: No     Allergies   Patient has no known allergies.   Review of Systems Review of Systems  Constitutional: Negative for chills and fever.  HENT: Positive for congestion, postnasal drip and rhinorrhea. Negative for sore throat.   Respiratory: Positive for cough, chest tightness, shortness of breath and wheezing.   Cardiovascular: Negative for chest pain.  Skin: Negative for rash.  Neurological: Negative for headaches.  Psychiatric/Behavioral: Negative for sleep disturbance.     Physical Exam Updated Vital Signs BP 127/73 (BP Location: Right Arm)   Pulse (!) 106   Temp 98 F (36.7 C) (Oral)   Resp 20   Ht 5\' 3"  (1.6 m)   Wt 81.6 kg   LMP 05/10/2018   SpO2 93% Comment: EDP aware  BMI 31.89 kg/m   Physical Exam  Constitutional: She appears well-developed and well-nourished. No distress.  HENT:  Head: Normocephalic and atraumatic.  Eyes: Right eye exhibits no discharge. Left eye exhibits no discharge. No scleral icterus.  Neck: Normal range of motion.  Cardiovascular: Normal rate, regular rhythm, normal heart sounds and intact distal pulses. Exam reveals no gallop and no friction rub.  No murmur heard. Pulmonary/Chest: No accessory muscle usage or stridor. No tachypnea. No respiratory distress. She has decreased breath sounds. She has wheezes. She has rhonchi. She has no rales.  Musculoskeletal: Normal range of motion.  Neurological: She is alert.  Skin: Skin is warm and dry. Capillary refill takes less than 2 seconds. No pallor.  Psychiatric: Her behavior is normal. Judgment and thought content normal.  Nursing note and vitals  reviewed.    ED Treatments / Results  Labs (all labs ordered are listed, but only abnormal results are displayed) Labs Reviewed - No data to display  EKG None  Radiology No results found.  Procedures Procedures (including critical care time)  Medications Ordered in ED Medications  ipratropium-albuterol (DUONEB) 0.5-2.5 (3) MG/3ML nebulizer solution (9 mLs  Given 05/17/18 1203)  predniSONE (DELTASONE) tablet 40 mg (40 mg Oral Given 05/17/18 1334)  albuterol (PROVENTIL, VENTOLIN) (5 MG/ML) 0.5% continuous inhalation solution (2 mg/hr  Given 05/17/18 1244)  benzonatate (TESSALON) capsule 100 mg (100 mg Oral Given 05/17/18 1334)     Initial Impression / Assessment and Plan / ED Course  I have reviewed the triage vital signs and the nursing notes.  Pertinent labs & imaging results that were available during my care of the patient were reviewed by me and considered in my medical decision making (see chart for details).     Patient ambulated in  ED with O2 saturations maintained >90, no current signs of respiratory distress. Lung exam improved after nebulizer treatment. Prednisone given in the ED and pt will bd dc with 5 day burst. Pt states they are breathing at baseline. aksed for refill of her breo. Will give one month supply and need for pcp follow up. Pt has been instructed to continue using prescribed medications and to speak with PCP about today's exacerbation.  Presentation not consistent with PE, ACS or dissection.  I offered patient chest x-ray however she refused.  Doubt pneumonia given that patient is afebrile.  Pt is hemodynamically stable, in NAD, & able to ambulate in the ED. Evaluation does not show pathology that would require ongoing emergent intervention or inpatient treatment. I explained the diagnosis to the patient. Pain has been managed & has no complaints prior to dc. Pt is comfortable with above plan and is stable for discharge at this time. All questions were  answered prior to disposition. Strict return precautions for f/u to the ED were discussed. Encouraged follow up with PCP.     Final Clinical Impressions(s) / ED Diagnoses   Final diagnoses:  Moderate persistent asthma with exacerbation    ED Discharge Orders         Ordered    predniSONE (DELTASONE) 20 MG tablet  Daily,   Status:  Discontinued     05/17/18 1443    fluticasone furoate-vilanterol (BREO ELLIPTA) 100-25 MCG/INH AEPB  Daily     05/17/18 1443    benzonatate (TESSALON) 100 MG capsule  Every 8 hours,   Status:  Discontinued     05/17/18 1443    benzonatate (TESSALON) 100 MG capsule  Every 8 hours     05/17/18 1446    predniSONE (DELTASONE) 20 MG tablet  Daily     05/17/18 1446    albuterol (PROVENTIL) (2.5 MG/3ML) 0.083% nebulizer solution  Every 4 hours PRN     05/17/18 1450           Rise Mu, PA-C 05/19/18 0728    Little, Ambrose Finland, MD 05/20/18 [phone]

## 2018-06-12 ENCOUNTER — Encounter (HOSPITAL_BASED_OUTPATIENT_CLINIC_OR_DEPARTMENT_OTHER): Payer: Self-pay | Admitting: Emergency Medicine

## 2018-06-12 ENCOUNTER — Other Ambulatory Visit: Payer: Self-pay

## 2018-06-12 ENCOUNTER — Emergency Department (HOSPITAL_BASED_OUTPATIENT_CLINIC_OR_DEPARTMENT_OTHER)
Admission: EM | Admit: 2018-06-12 | Discharge: 2018-06-12 | Discharge status: Against Medical Advice | Payer: Self-pay | Attending: Emergency Medicine | Admitting: Emergency Medicine

## 2018-06-12 DIAGNOSIS — Z79899 Other long term (current) drug therapy: Secondary | ICD-10-CM | POA: Insufficient documentation

## 2018-06-12 DIAGNOSIS — J45901 Unspecified asthma with (acute) exacerbation: Secondary | ICD-10-CM | POA: Insufficient documentation

## 2018-06-12 MED ORDER — PREDNISONE 50 MG PO TABS
60.0000 mg | ORAL_TABLET | Freq: Once | ORAL | Status: AC
  Administered 2018-06-12: 60 mg via ORAL
  Filled 2018-06-12: qty 1

## 2018-06-12 MED ORDER — ALBUTEROL SULFATE (2.5 MG/3ML) 0.083% IN NEBU: 2.5000 mg | INHALATION_SOLUTION | RESPIRATORY_TRACT | 0 refills | Status: AC | PRN

## 2018-06-12 MED ORDER — ALBUTEROL SULFATE (2.5 MG/3ML) 0.083% IN NEBU
2.5000 mg | INHALATION_SOLUTION | Freq: Once | RESPIRATORY_TRACT | Status: AC
  Administered 2018-06-12: 2.5 mg via RESPIRATORY_TRACT

## 2018-06-12 MED ORDER — IPRATROPIUM BROMIDE 0.02 % IN SOLN
RESPIRATORY_TRACT | Status: AC
  Administered 2018-06-12: 0.5 mg
  Filled 2018-06-12: qty 2.5

## 2018-06-12 MED ORDER — BENZONATATE 100 MG PO CAPS: 100.0000 mg | ORAL_CAPSULE | Freq: Three times a day (TID) | ORAL | 0 refills | Status: DC

## 2018-06-12 MED ORDER — ALBUTEROL SULFATE (2.5 MG/3ML) 0.083% IN NEBU
INHALATION_SOLUTION | RESPIRATORY_TRACT | Status: AC
  Administered 2018-06-12: 2.5 mg via RESPIRATORY_TRACT
  Filled 2018-06-12: qty 3

## 2018-06-12 MED ORDER — AMOXICILLIN 500 MG PO CAPS: 500.0000 mg | ORAL_CAPSULE | Freq: Three times a day (TID) | ORAL | 0 refills | Status: AC

## 2018-06-12 MED ORDER — IPRATROPIUM-ALBUTEROL 0.5-2.5 (3) MG/3ML IN SOLN
RESPIRATORY_TRACT | Status: AC
  Administered 2018-06-12: 3 mL via RESPIRATORY_TRACT
  Filled 2018-06-12: qty 3

## 2018-06-12 MED ORDER — ALBUTEROL SULFATE (2.5 MG/3ML) 0.083% IN NEBU
5.0000 mg | INHALATION_SOLUTION | Freq: Once | RESPIRATORY_TRACT | Status: AC
  Administered 2018-06-12: 5 mg via RESPIRATORY_TRACT
  Filled 2018-06-12: qty 6

## 2018-06-12 MED ORDER — IPRATROPIUM-ALBUTEROL 0.5-2.5 (3) MG/3ML IN SOLN
3.0000 mL | Freq: Once | RESPIRATORY_TRACT | Status: AC
  Administered 2018-06-12: 3 mL via RESPIRATORY_TRACT

## 2018-06-12 MED ORDER — PREDNISONE 20 MG PO TABS: 40.0000 mg | ORAL_TABLET | Freq: Every day | ORAL | 0 refills | Status: AC

## 2018-06-12 NOTE — Discharge Instructions (Addendum)
Your oxygen levels were found to drop when you walk today.  It was recommended that you be admitted, but you just chose to go home.  Understand, that your breathing could worsen to the point where you could die.  Take prednisone as prescribed. Take antibiotics as prescribed, take the entire course even if your symptoms improve. Continue using albuterol every 2-4 hours for the next 2 days.  After this, only as needed for shortness of breath or wheezing. Use Tessalon Perles to help decrease cough. Use over-the-counter cough syrups, such as Robitussin. Make sure you are staying well-hydrated water. Return to the emergency room if you develop any new, worsening, concerning symptoms.

## 2018-06-12 NOTE — ED Notes (Addendum)
Pt states she has been sob x 2 days, denies chest pain. Pt ambulatory to room, o2 sats between 94-96. Bilateral expiratory wheezing.

## 2018-06-12 NOTE — ED Provider Notes (Signed)
[facility] EMERGENCY DEPARTMENT Provider Note   CSN: [id number] Arrival date & time: 06/12/18  1053     History   Chief Complaint Chief Complaint  Patient presents with  . Asthma    HPI Kimberly Harmon is a 47 y.o. female presenting for evaluation of shortness of breath and wheezing.  Patient states for the past 2 days, she has had increased shortness of breath and wheezing.  States this feels similar to her previous asthma exacerbations.  It began with nasal congestion, and she has had subsequent increased difficulty breathing.  Symptoms are worse at night.  She reports associated nonproductive cough and nasal congestion.  She denies fevers, chills, ear pain, chest pain, nausea, vomiting, abdominal pain, urinary symptoms, normal bowel movements.  Other than asthma, she has no other medical problems.  She has been using her inhaler without improvement of symptoms.  She no longer has her nebulizer solution.  Patient states she is not taking a preventative asthma medication.  Takes daily cetirizine.  She moved to [place] 6 months ago, has not establish care with a primary care doctor yet.  HPI  Past Medical History:  Diagnosis Date  . Asthma     There are no active problems to display for this patient.   Past Surgical History:  Procedure Laterality Date  . BACK SURGERY    . NECK SURGERY       OB History   None      Home Medications    Prior to Admission medications   Medication Sig Start Date End Date Taking? Authorizing Provider  albuterol (PROVENTIL HFA;VENTOLIN HFA) 108 (90 Base) MCG/ACT inhaler Inhale 1-2 puffs into the lungs every 4 (four) hours as needed for wheezing or shortness of breath. 01/25/18   Raeford Razor, MD  albuterol (PROVENTIL) (2.5 MG/3ML) 0.083% nebulizer solution Take 3 mLs (2.5 mg total) by nebulization every 4 (four) hours as needed for wheezing or shortness of breath. 06/12/18   Agata Lucente, PA-C  amoxicillin (AMOXIL) 500 MG  capsule Take 1 capsule (500 mg total) by mouth 3 (three) times daily. 06/12/18   Carrine Kroboth, PA-C  benzonatate (TESSALON) 100 MG capsule Take 1 capsule (100 mg total) by mouth every 8 (eight) hours. 06/12/18   Chalet Kerwin, PA-C  buPROPion (WELLBUTRIN XL) 150 MG 24 hr tablet Take 1 tablet (150 mg total) by mouth daily. 04/29/18   Arfeen, Phillips Grout, MD  clonazePAM (KLONOPIN) 0.5 MG tablet Take 1 tablet (0.5 mg total) by mouth at bedtime. 04/29/18 04/29/19  Arfeen, Phillips Grout, MD  doxycycline (VIBRAMYCIN) 100 MG capsule Take 1 capsule (100 mg total) by mouth 2 (two) times daily. 04/16/18   Maczis, Elmer Sow, PA-C  doxycycline (VIBRAMYCIN) 100 MG capsule Take 1 capsule (100 mg total) by mouth 2 (two) times daily. 04/16/18   Maczis, Elmer Sow, PA-C  fluticasone furoate-vilanterol (BREO ELLIPTA) 100-25 MCG/INH AEPB Inhale 1 puff into the lungs daily. 05/17/18   Rise Mu, PA-C  Guaifenesin (MUCINEX MAXIMUM STRENGTH) 1200 MG TB12 Take 1 tablet (1,200 mg total) by mouth every 12 (twelve) hours as needed. 04/16/18   Maczis, Elmer Sow, PA-C  Guaifenesin (MUCINEX MAXIMUM STRENGTH) 1200 MG TB12 Take 1 tablet (1,200 mg total) by mouth every 12 (twelve) hours as needed. 04/16/18   Maczis, Elmer Sow, PA-C  hydrOXYzine (VISTARIL) 50 MG capsule Take 1 capsule (50 mg total) by mouth daily as needed for anxiety. 10/15/17   Arfeen, Phillips Grout, MD  methocarbamol (ROBAXIN) 500 MG tablet  Take 1,000 mg daily as needed by mouth.    [provider]  predniSONE (DELTASONE) 20 MG tablet Take 2 tablets (40 mg total) by mouth daily for 5 days. 06/12/18 06/17/18  Kauri Garson, PA-C  traMADol (ULTRAM) 50 MG tablet Take every 6 (six) hours as needed by mouth.    [provider]    Family History Family History  Problem Relation Age of Onset  . Depression Sister     Social History Social History   Tobacco Use  . Smoking status: Never Smoker  . Smokeless tobacco: Never Used  Substance Use Topics    . Alcohol use: No    Frequency: Never  . Drug use: No     Allergies   Patient has no known allergies.   Review of Systems Review of Systems  HENT: Positive for congestion.   Respiratory: Positive for cough, shortness of breath and wheezing.   All other systems reviewed and are negative.    Physical Exam Updated Vital Signs BP 125/83   Pulse 95   Temp 97.7 F (36.5 C) (Oral)   Resp 18   Ht 5\' 3"  (1.6 m)   Wt 81 kg   LMP 06/08/2018 (Approximate)   SpO2 100%   BMI 31.63 kg/m   Physical Exam  Constitutional: She is oriented to person, place, and time. She appears well-developed and well-nourished. No distress.  Appears nontoxic  HENT:  Head: Normocephalic and atraumatic.  Right Ear: Tympanic membrane, external ear and ear canal normal.  Left Ear: Tympanic membrane, external ear and ear canal normal.  Nose: Mucosal edema present. Right sinus exhibits no maxillary sinus tenderness and no frontal sinus tenderness. Left sinus exhibits no maxillary sinus tenderness and no frontal sinus tenderness.  Mouth/Throat: Uvula is midline, oropharynx is clear and moist and mucous membranes are normal. No tonsillar exudate.  OP clear without tonsillar swelling or exudate.  Uvula midline with equal palate rise.  TMs nonerythematous and not bulging bilaterally.  Eyes: Pupils are equal, round, and reactive to light. Conjunctivae and EOM are normal.  Neck: Normal range of motion.  Cardiovascular: Normal rate, regular rhythm and intact distal pulses.  Pulmonary/Chest: Effort normal. No accessory muscle usage. No respiratory distress. She has no decreased breath sounds. She has wheezes. She has no rhonchi. She has no rales.  Pt speaking in short sentences.  Inspiratory and expiratory wheezing in all fields  Abdominal: Soft. She exhibits no distension and no mass. There is no tenderness. There is no guarding.  Musculoskeletal: Normal range of motion.  Lymphadenopathy:    She has no cervical  adenopathy.  Neurological: She is alert and oriented to person, place, and time.  Skin: Skin is warm. Capillary refill takes less than 2 seconds.  Psychiatric: She has a normal mood and affect.  Nursing note and vitals reviewed.    ED Treatments / Results  Labs (all labs ordered are listed, but only abnormal results are displayed) Labs Reviewed - No data to display  EKG None  Radiology No results found.  Procedures .Critical Care Performed by: Alveria Apleyaccavale, Dell Hurtubise, PA-C Authorized by: Alveria Apleyaccavale, Ziyonna Christner, PA-C   Critical care provider statement:    Critical care time (minutes):  40   Critical care time was exclusive of:  Separately billable procedures and treating other patients and teaching time   Critical care was necessary to treat or prevent imminent or life-threatening deterioration of the following conditions:  Respiratory failure   Critical care was time spent personally by  me on the following activities:  Blood draw for specimens, development of treatment plan with patient or surrogate, evaluation of patient's response to treatment, examination of patient, obtaining history from patient or surrogate, ordering and performing treatments and interventions, ordering and review of laboratory studies, ordering and review of radiographic studies, pulse oximetry, re-evaluation of patient's condition and review of old charts   I assumed direction of critical care for this patient from another provider in my specialty: no   Comments:     Pt with asthma exacerbation requiring multiple breathing treatments. Pt remained hypoxic, especially with ambulation. Admission recommended   (including critical care time)  Medications Ordered in ED Medications  albuterol (PROVENTIL) (2.5 MG/3ML) 0.083% nebulizer solution 5 mg (5 mg Nebulization Given 06/12/18 1127)  ipratropium (ATROVENT) 0.02 % nebulizer solution (0.5 mg  Given 06/12/18 1127)  predniSONE (DELTASONE) tablet 60 mg (60 mg Oral Given  06/12/18 1222)  albuterol (PROVENTIL) (2.5 MG/3ML) 0.083% nebulizer solution 5 mg (5 mg Nebulization Given 06/12/18 1255)  ipratropium-albuterol (DUONEB) 0.5-2.5 (3) MG/3ML nebulizer solution 3 mL (3 mLs Nebulization Given 06/12/18 1342)  albuterol (PROVENTIL) (2.5 MG/3ML) 0.083% nebulizer solution 2.5 mg (2.5 mg Nebulization Given 06/12/18 1343)     Initial Impression / Assessment and Plan / ED Course  I have reviewed the triage vital signs and the nursing notes.  Pertinent labs & imaging results that were available during my care of the patient were reviewed by me and considered in my medical decision making (see chart for details).     Pt presenting for evaluation of shortness of breath and physical exam concerning, pulse ox in the low 90s and patient with significant inspiratory and expiratory wheezing.  However, no respiratory distress or accessory muscle use.  She is afebrile not tachycardic.  Appears nontoxic.  Exam was performed after first DuoNeb treatment.  Will give another treatment of albuterol and prednisone and reassess.  After second treatment, patient has remaining expiratory wheezing in all fields.  Patient states she feels better at rest, but still feels short of breath when walking.  Pulse ox remains in the low 90s.  Will give third treatment and reassess.  After third treatment, patient without significant improvement.  With ambulation, pulse ox drops to 87%.  Patient without fevers or productive cough, low suspicion for pneumonia.  She does not want chest x-ray today, and I agree with this plan.  I believe symptoms are more likely from asthma exacerbation.  Discussed with patient that at this time I recommend admission due to her continued low oxygen status and continued wheezing.  Patient does not want to be admitted.  Discussed with patient that symptoms could worsen and she could die.  Patient states she understands, she will closely monitor her symptoms and return as  needed.  Will discharge patient on prednisone.  Antibiotics to cover for possible sinus infection, as patient has continued nasal congestion.  She signed AMA paperwork.   Final Clinical Impressions(s) / ED Diagnoses   Final diagnoses:  Exacerbation of asthma, unspecified asthma severity, unspecified whether persistent    ED Discharge Orders         Ordered    amoxicillin (AMOXIL) 500 MG capsule  3 times daily     06/12/18 1450    albuterol (PROVENTIL) (2.5 MG/3ML) 0.083% nebulizer solution  Every 4 hours PRN     06/12/18 1450    benzonatate (TESSALON) 100 MG capsule  Every 8 hours     06/12/18 1450  predniSONE (DELTASONE) 20 MG tablet  Daily     06/12/18 1450           Alveria Apley, PA-C 06/12/18 1950    Arby Barrette, MD 06/13/18 [phone]

## 2018-06-12 NOTE — ED Triage Notes (Signed)
Reports shortness of breath x 2 days.  History of asthma.  Breathing treatments at home without relief.  Worsening shortness of breath since last night.

## 2018-06-12 NOTE — ED Notes (Signed)
Walked patient here in the ED. SpO2 87% HR 122. Some increase WOB is noted. PA made aware.

## 2018-07-08 ENCOUNTER — Encounter (HOSPITAL_BASED_OUTPATIENT_CLINIC_OR_DEPARTMENT_OTHER): Payer: Self-pay | Admitting: *Deleted

## 2018-07-08 ENCOUNTER — Emergency Department (HOSPITAL_BASED_OUTPATIENT_CLINIC_OR_DEPARTMENT_OTHER)
Admission: EM | Admit: 2018-07-08 | Discharge: 2018-07-08 | Disposition: A | Discharge status: Home / Self Care | Payer: Self-pay | Attending: Emergency Medicine | Admitting: Emergency Medicine

## 2018-07-08 ENCOUNTER — Ambulatory Visit (HOSPITAL_COMMUNITY): Payer: Self-pay | Admitting: Psychiatry

## 2018-07-08 DIAGNOSIS — Z79899 Other long term (current) drug therapy: Secondary | ICD-10-CM | POA: Insufficient documentation

## 2018-07-08 DIAGNOSIS — J45901 Unspecified asthma with (acute) exacerbation: Secondary | ICD-10-CM | POA: Insufficient documentation

## 2018-07-08 DIAGNOSIS — J069 Acute upper respiratory infection, unspecified: Secondary | ICD-10-CM | POA: Insufficient documentation

## 2018-07-08 MED ORDER — ALBUTEROL SULFATE HFA 108 (90 BASE) MCG/ACT IN AERS
INHALATION_SPRAY | RESPIRATORY_TRACT | Status: AC
  Administered 2018-07-08: 2
  Filled 2018-07-08: qty 6.7

## 2018-07-08 MED ORDER — ALBUTEROL SULFATE (2.5 MG/3ML) 0.083% IN NEBU
2.5000 mg | INHALATION_SOLUTION | Freq: Once | RESPIRATORY_TRACT | Status: AC
  Administered 2018-07-08: 2.5 mg via RESPIRATORY_TRACT
  Filled 2018-07-08: qty 3

## 2018-07-08 MED ORDER — IPRATROPIUM-ALBUTEROL 0.5-2.5 (3) MG/3ML IN SOLN
3.0000 mL | Freq: Four times a day (QID) | RESPIRATORY_TRACT | Status: DC
  Administered 2018-07-08: 3 mL via RESPIRATORY_TRACT
  Filled 2018-07-08: qty 3

## 2018-07-08 MED ORDER — IPRATROPIUM-ALBUTEROL 0.5-2.5 (3) MG/3ML IN SOLN
RESPIRATORY_TRACT | Status: AC
  Administered 2018-07-08: 3 mL
  Filled 2018-07-08: qty 3

## 2018-07-08 MED ORDER — ALBUTEROL SULFATE (2.5 MG/3ML) 0.083% IN NEBU
INHALATION_SOLUTION | RESPIRATORY_TRACT | Status: AC
  Administered 2018-07-08: 2.5 mg
  Filled 2018-07-08: qty 3

## 2018-07-08 MED ORDER — BENZONATATE 100 MG PO CAPS: 100.0000 mg | ORAL_CAPSULE | Freq: Three times a day (TID) | ORAL | 0 refills | Status: AC

## 2018-07-08 MED ORDER — PREDNISONE 20 MG PO TABS: 40.0000 mg | ORAL_TABLET | Freq: Every day | ORAL | 0 refills | Status: AC

## 2018-07-08 MED ORDER — PREDNISONE 50 MG PO TABS
60.0000 mg | ORAL_TABLET | Freq: Once | ORAL | Status: AC
  Administered 2018-07-08: 60 mg via ORAL
  Filled 2018-07-08: qty 1

## 2018-07-08 NOTE — Discharge Instructions (Signed)
Take prednisone as prescribed.  Take the entire course, even if your symptoms improve. Use Tessalon Perles to help decrease coughing.  Use cough drops in between. Continue with nasal sprays to help decrease nasal congestion. Use the albuterol inhaler every 4-6 hours for the next 2 days.  After that, use as needed for wheezing, chest tightness, shortness of breath. Return to the emergency room with any new, worsening, or concerning symptoms.

## 2018-07-08 NOTE — ED Provider Notes (Signed)
[facility] EMERGENCY DEPARTMENT Provider Note   CSN: [id number] Arrival date & time: 07/08/18  1306     History   Chief Complaint Chief Complaint  Patient presents with  . Shortness of Breath    HPI Kimberly Harmon is a 47 y.o. female presenting for evaluation of shortness of breath.  Patient states that the past 2 weeks, she has been feeling poorly.  2 weeks ago she was diagnosed with flu, and since then, she has had continued cough and shortness of breath.  She reports a history of asthma.  The past 5 days, symptoms have worsened.  She is using her nebulizer 5-6 times a day.  Patient states she has a productive cough.  She denies fevers, chills, sore throat, chest pain, nausea, vomiting, abdominal pain.  She has been using saline and nasal sprays for congestion.   HPI  Past Medical History:  Diagnosis Date  . Asthma     There are no active problems to display for this patient.   Past Surgical History:  Procedure Laterality Date  . BACK SURGERY    . NECK SURGERY       OB History   No obstetric history on file.      Home Medications    Prior to Admission medications   Medication Sig Start Date End Date Taking? Authorizing Provider  albuterol (PROVENTIL HFA;VENTOLIN HFA) 108 (90 Base) MCG/ACT inhaler Inhale 1-2 puffs into the lungs every 4 (four) hours as needed for wheezing or shortness of breath. 01/25/18  Yes Raeford RazorKohut, Stephen, MD  albuterol (PROVENTIL) (2.5 MG/3ML) 0.083% nebulizer solution Take 3 mLs (2.5 mg total) by nebulization every 4 (four) hours as needed for wheezing or shortness of breath. 06/12/18  Yes Omesha Bowerman, PA-C  buPROPion (WELLBUTRIN XL) 150 MG 24 hr tablet Take 1 tablet (150 mg total) by mouth daily. 04/29/18  Yes Arfeen, Phillips GroutSyed T, MD  clonazePAM (KLONOPIN) 0.5 MG tablet Take 1 tablet (0.5 mg total) by mouth at bedtime. 04/29/18 04/29/19 Yes Arfeen, Phillips GroutSyed T, MD  amoxicillin (AMOXIL) 500 MG capsule Take 1 capsule (500 mg total) by mouth  3 (three) times daily. 06/12/18   Bernetta Sutley, PA-C  benzonatate (TESSALON) 100 MG capsule Take 1 capsule (100 mg total) by mouth every 8 (eight) hours. 07/08/18   Edwen Mclester, PA-C  doxycycline (VIBRAMYCIN) 100 MG capsule Take 1 capsule (100 mg total) by mouth 2 (two) times daily. 04/16/18   Maczis, Elmer SowMichael M, PA-C  doxycycline (VIBRAMYCIN) 100 MG capsule Take 1 capsule (100 mg total) by mouth 2 (two) times daily. 04/16/18   Maczis, Elmer SowMichael M, PA-C  fluticasone furoate-vilanterol (BREO ELLIPTA) 100-25 MCG/INH AEPB Inhale 1 puff into the lungs daily. 05/17/18   Rise MuLeaphart, Kenneth T, PA-C  Guaifenesin (MUCINEX MAXIMUM STRENGTH) 1200 MG TB12 Take 1 tablet (1,200 mg total) by mouth every 12 (twelve) hours as needed. 04/16/18   Maczis, Elmer SowMichael M, PA-C  Guaifenesin (MUCINEX MAXIMUM STRENGTH) 1200 MG TB12 Take 1 tablet (1,200 mg total) by mouth every 12 (twelve) hours as needed. 04/16/18   Maczis, Elmer SowMichael M, PA-C  hydrOXYzine (VISTARIL) 50 MG capsule Take 1 capsule (50 mg total) by mouth daily as needed for anxiety. 10/15/17   Arfeen, Phillips GroutSyed T, MD  methocarbamol (ROBAXIN) 500 MG tablet Take 1,000 mg daily as needed by mouth.    [provider]  predniSONE (DELTASONE) 20 MG tablet Take 2 tablets (40 mg total) by mouth daily for 5 days. 07/08/18 07/13/18  Jolan Upchurch, PA-C  traMADol (  ULTRAM) 50 MG tablet Take every 6 (six) hours as needed by mouth.    [provider]    Family History Family History  Problem Relation Age of Onset  . Depression Sister     Social History Social History   Tobacco Use  . Smoking status: Never Smoker  . Smokeless tobacco: Never Used  Substance Use Topics  . Alcohol use: No    Frequency: Never  . Drug use: No     Allergies   Patient has no known allergies.   Review of Systems Review of Systems  HENT: Positive for congestion, sinus pressure and sinus pain.   Respiratory: Positive for cough, chest tightness, shortness of breath and  wheezing.   All other systems reviewed and are negative.    Physical Exam Updated Vital Signs BP 133/87 (BP Location: Right Arm)   Pulse (!) 102   Temp 97.7 F (36.5 C) (Oral)   Resp 16   Ht 5\' 3"  (1.6 m) Comment: Simultaneous filing. User may not have seen previous data.  Wt 81 kg Comment: Simultaneous filing. User may not have seen previous data.  LMP 06/05/2018   SpO2 96%   BMI 31.63 kg/m   Physical Exam Vitals signs and nursing note reviewed.  Constitutional:      General: She is not in acute distress.    Appearance: She is well-developed.     Comments: Sitting comfortably in the bed in no acute distress  HENT:     Head: Normocephalic and atraumatic.     Comments: OP mildly erythematous without tonsillar swelling or exudate.  Uvula midline with palate rise.  TMs nonerythematous and not bulging bilaterally.  Nasal mucosal edema/congestion.    Nose: Mucosal edema and congestion present.     Mouth/Throat:     Pharynx: Posterior oropharyngeal erythema present.  Eyes:     Conjunctiva/sclera: Conjunctivae normal.     Pupils: Pupils are equal, round, and reactive to light.  Neck:     Musculoskeletal: Normal range of motion and neck supple.  Cardiovascular:     Rate and Rhythm: Normal rate and regular rhythm.  Pulmonary:     Effort: Pulmonary effort is normal. No respiratory distress.     Breath sounds: Wheezing present. No decreased breath sounds, rhonchi or rales.     Comments: Speaking in full sentences.  No signs of respiratory distress.  Expiratory wheezing in all fields.  No rales or rhonchi. Abdominal:     General: Bowel sounds are normal. There is no distension.     Palpations: Abdomen is soft.     Tenderness: There is no abdominal tenderness.  Musculoskeletal: Normal range of motion.  Skin:    General: Skin is warm and dry.     Capillary Refill: Capillary refill takes less than 2 seconds.  Neurological:     Mental Status: She is alert and oriented to person,  place, and time.      ED Treatments / Results  Labs (all labs ordered are listed, but only abnormal results are displayed) Labs Reviewed - No data to display  EKG None  Radiology No results found.  Procedures Procedures (including critical care time)  Medications Ordered in ED Medications  albuterol (PROVENTIL) (2.5 MG/3ML) 0.083% nebulizer solution (2.5 mg  Given 07/08/18 1329)  ipratropium-albuterol (DUONEB) 0.5-2.5 (3) MG/3ML nebulizer solution (3 mLs  Given 07/08/18 1329)  predniSONE (DELTASONE) tablet 60 mg (60 mg Oral Given 07/08/18 1545)  albuterol (PROVENTIL) (2.5 MG/3ML) 0.083% nebulizer solution 2.5 mg (  2.5 mg Nebulization Given by Other 07/08/18 1548)  albuterol (PROVENTIL HFA;VENTOLIN HFA) 108 (90 Base) MCG/ACT inhaler (2 puffs  Given 07/08/18 1629)     Initial Impression / Assessment and Plan / ED Course  I have reviewed the triage vital signs and the nursing notes.  Pertinent labs & imaging results that were available during my care of the patient were reviewed by me and considered in my medical decision making (see chart for details).     She presented for evaluation of shortness of breath.  Physical exam reassuring, patient is afebrile not tachycardic.  No signs of respiratory distress.  However, significant expiratory wheezing in all fields.  Patient states that after the DuoNeb treatment in triage, she feels better.  However, she is continued wheezing, will give prednisone, repeat treatment, and reassess.  On reassessment, patient reports she continues to feel better.  Sats remained stable.  Wheezing mildly improved, but still present.  Discussed with patient.  Discussed treatment with prednisone at home, continued albuterol use.  Follow-up with PCP.  At this time, patient appears safe for discharge.  Return precautions given.  Patient states she understands and agrees to plan.  Final Clinical Impressions(s) / ED Diagnoses   Final diagnoses:  Exacerbation  of asthma, unspecified asthma severity, unspecified whether persistent  Upper respiratory tract infection, unspecified type    ED Discharge Orders         Ordered    benzonatate (TESSALON) 100 MG capsule  Every 8 hours     07/08/18 1640    predniSONE (DELTASONE) 20 MG tablet  Daily     07/08/18 1640           Maleiyah Releford, PA-C 07/08/18 2032    Vanetta Mulders, MD 07/09/18 [phone]

## 2018-07-08 NOTE — ED Triage Notes (Signed)
Sob. She has a hx of asthma. She used her nebulizer at home without relief. She is able to speak incomplete sentences without cough.

## 2018-09-29 ENCOUNTER — Other Ambulatory Visit (HOSPITAL_COMMUNITY): Payer: Self-pay

## 2018-10-04 ENCOUNTER — Telehealth (HOSPITAL_COMMUNITY): Payer: Self-pay

## 2018-10-04 NOTE — Telephone Encounter (Signed)
Medication refill request - Fax received from pt's [facility] pharmacy for a refill of her prescribed Clonazepam, last ordered 90 days 04/29/18. Pt. cancelled 07/08/18 due to sick. Now rescheduled for 11/06/18.

## 2018-10-04 NOTE — Telephone Encounter (Signed)
Please call for only 15-day supply until she had appointment.

## 2018-10-05 ENCOUNTER — Other Ambulatory Visit (HOSPITAL_COMMUNITY): Payer: Self-pay

## 2018-10-05 DIAGNOSIS — F331 Major depressive disorder, recurrent, moderate: Secondary | ICD-10-CM

## 2018-10-05 MED ORDER — BUPROPION HCL ER (XL) 150 MG PO TB24: 150.0000 mg | ORAL_TABLET | Freq: Every day | ORAL | 0 refills | Status: AC

## 2018-10-05 MED ORDER — HYDROXYZINE PAMOATE 50 MG PO CAPS: 50.0000 mg | ORAL_CAPSULE | Freq: Every day | ORAL | 0 refills | Status: AC | PRN

## 2018-10-05 MED ORDER — CLONAZEPAM 0.5 MG PO TABS: 0.5000 mg | ORAL_TABLET | Freq: Every day | ORAL | 0 refills | Status: AC

## 2018-11-06 ENCOUNTER — Ambulatory Visit (HOSPITAL_COMMUNITY): Payer: Self-pay | Admitting: Psychiatry

## 2020-05-15 IMAGING — CR DG CHEST 2V
2 series · 2 of 2 positions shown · non-contrast
Comparison: None.

CLINICAL DATA: Shortness of breath with cough

EXAM:
CHEST - 2 VIEW

[w chest pa]
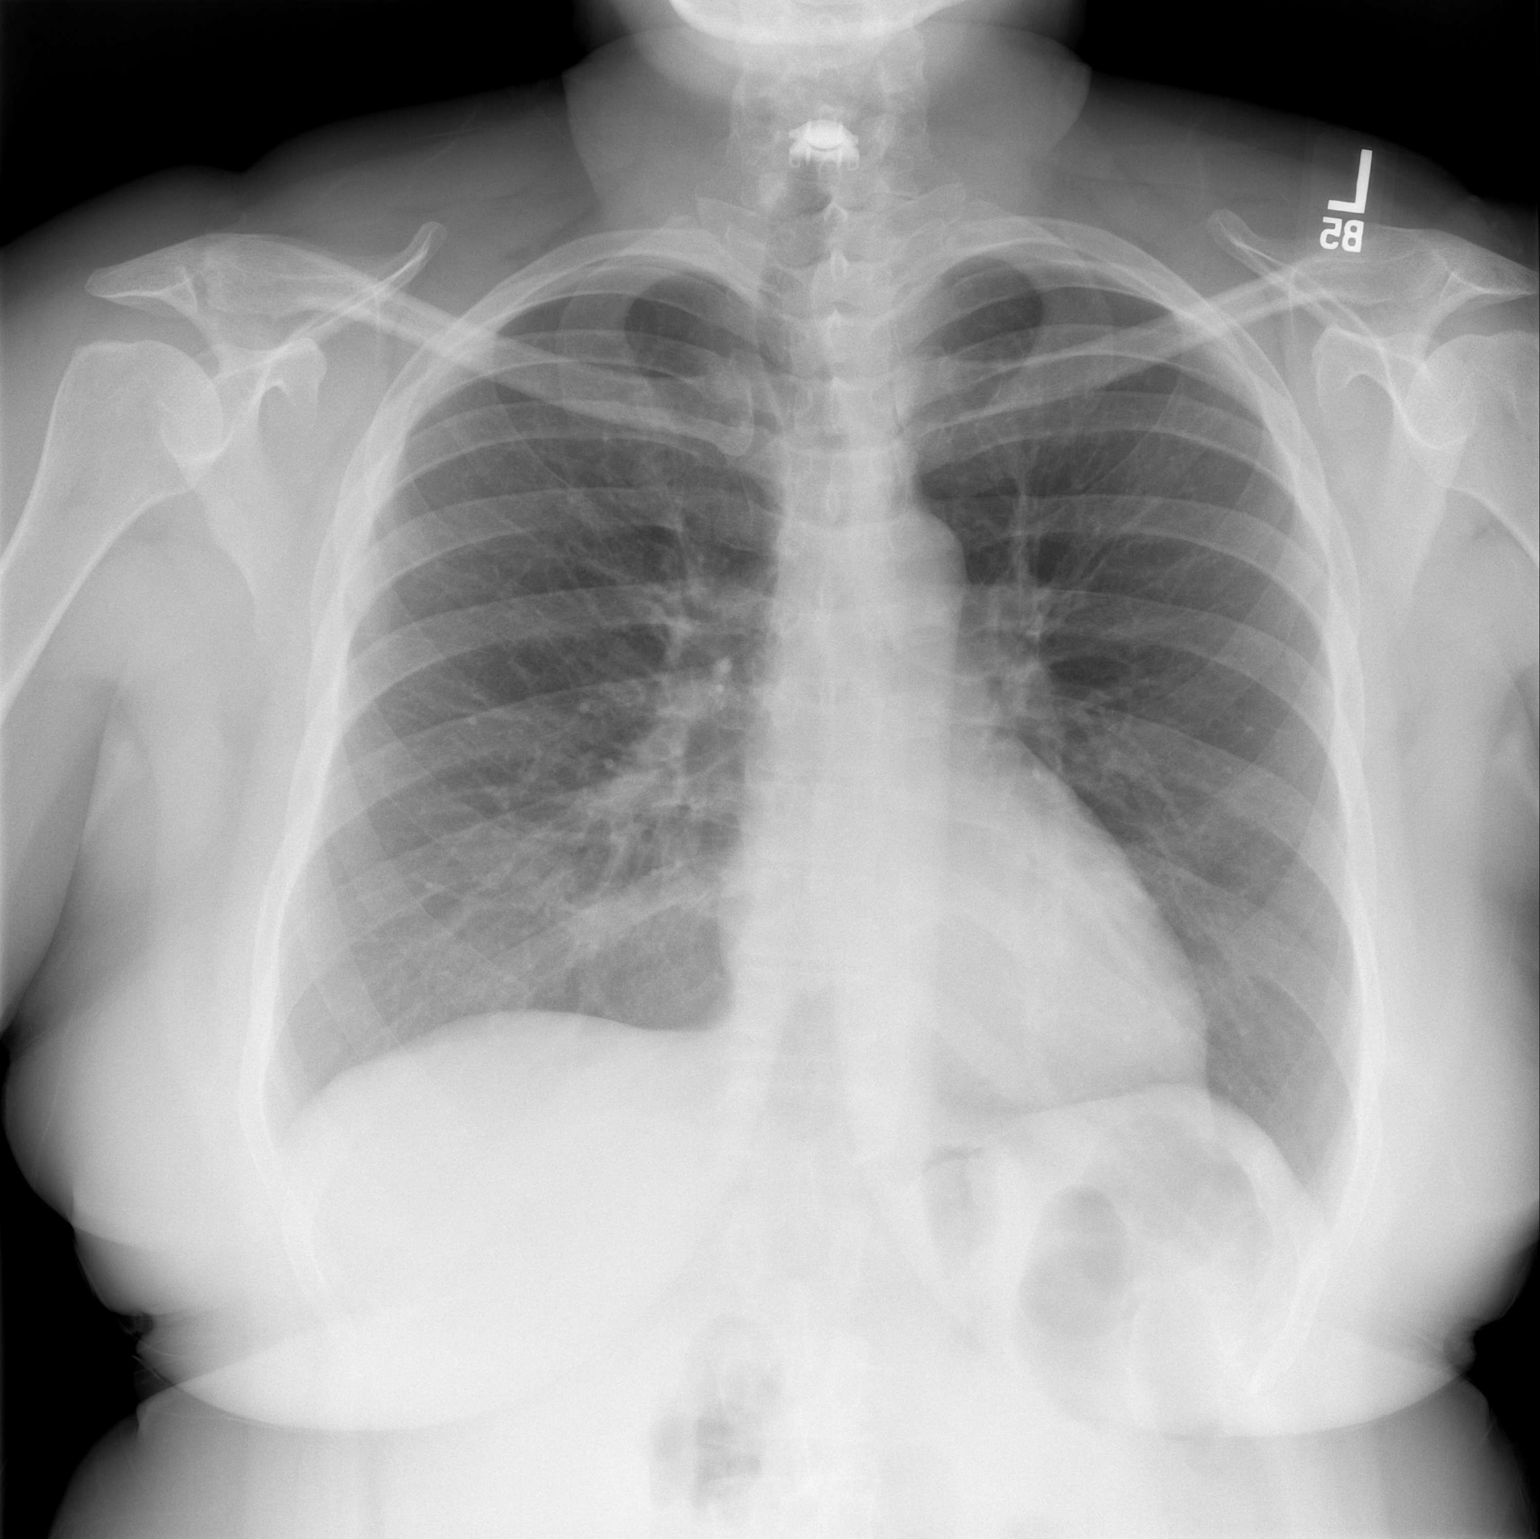

[w chest lat]
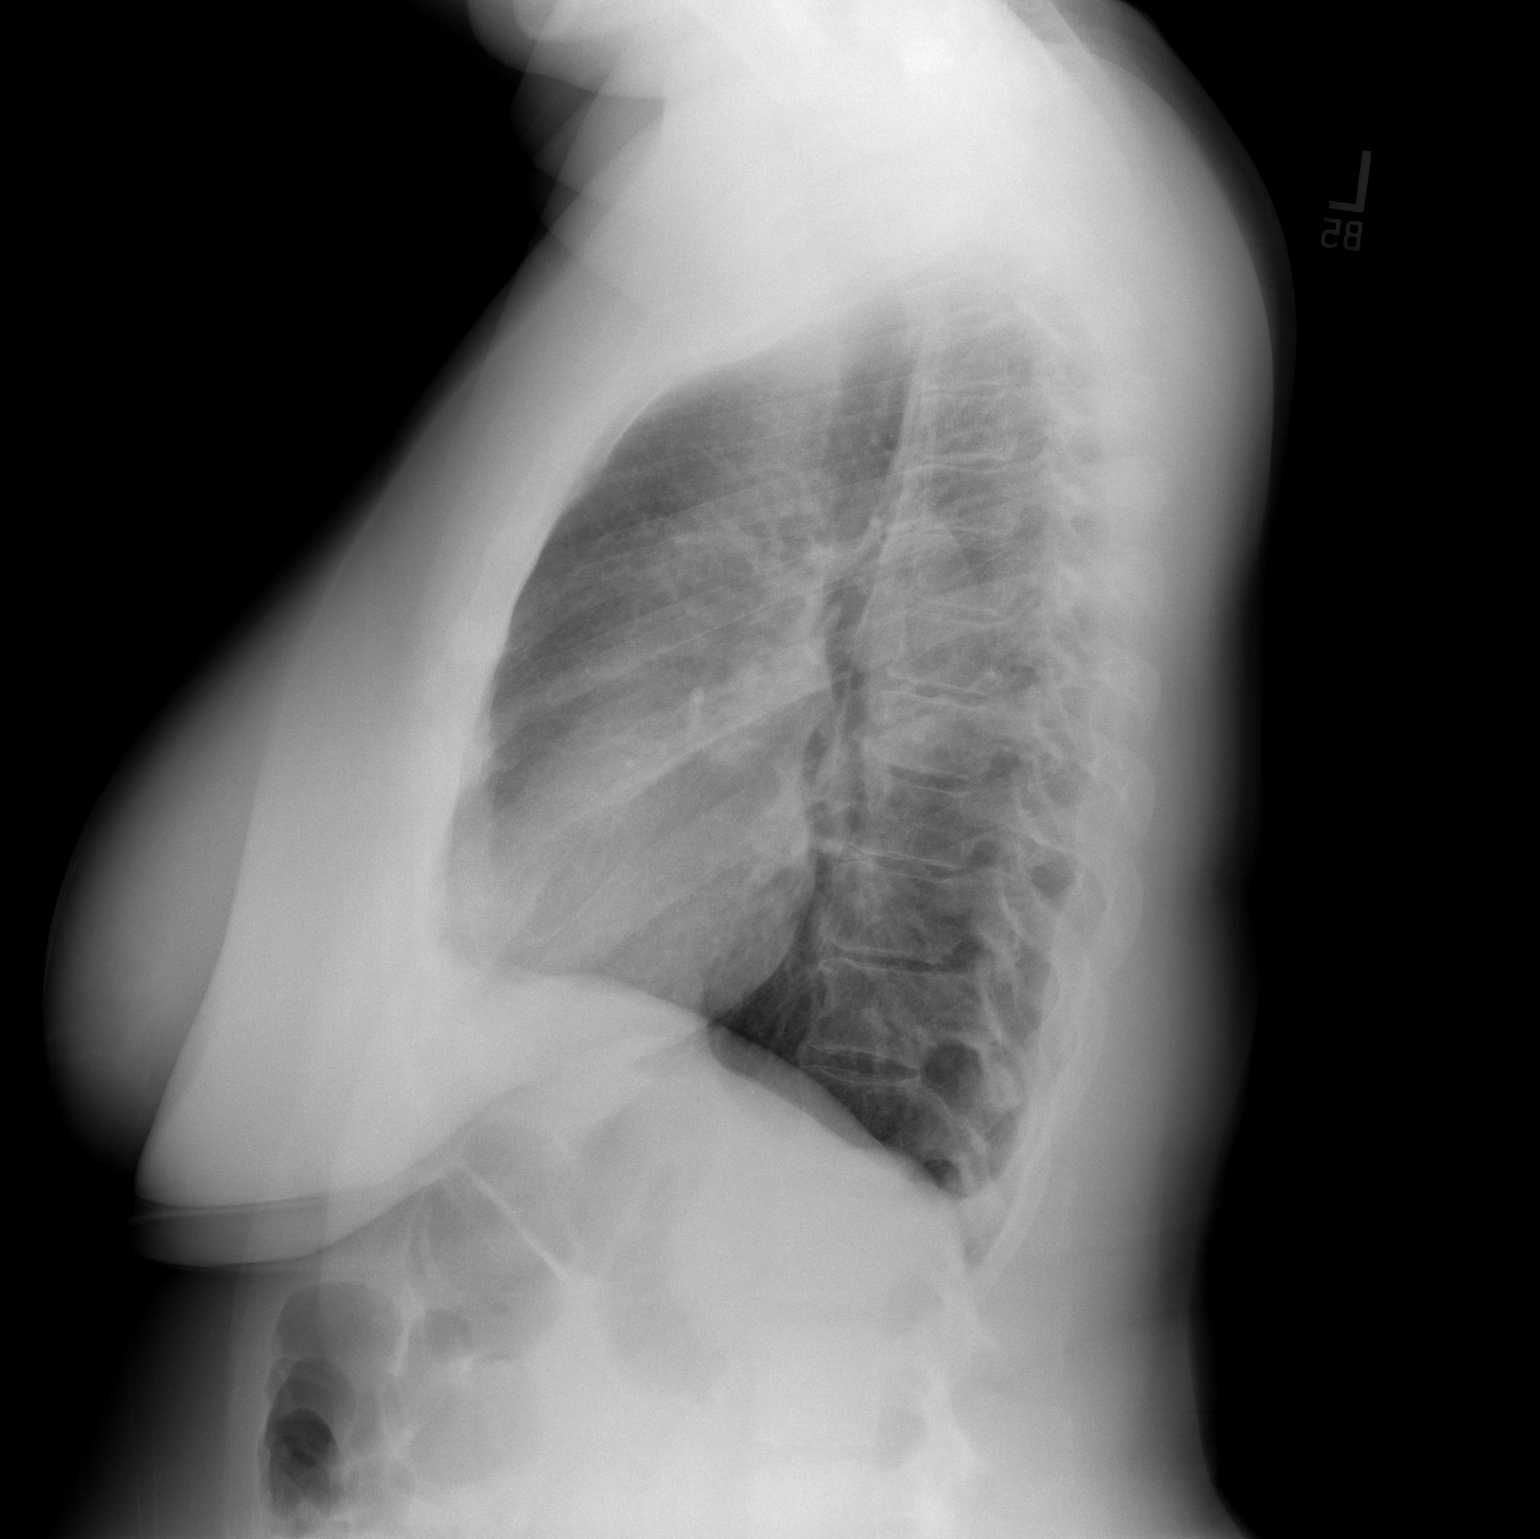

[2 of 2 positions shown; findings below may reference images not displayed]

FINDINGS: Tiny left pleural effusion. No focal consolidation. Normal heart
size. No pneumothorax.
IMPRESSION: Trace left pleural effusion.

## 2021-05-21 ENCOUNTER — Other Ambulatory Visit: Payer: Self-pay

## 2021-05-21 ENCOUNTER — Emergency Department (HOSPITAL_BASED_OUTPATIENT_CLINIC_OR_DEPARTMENT_OTHER)
Admission: EM | Admit: 2021-05-21 | Discharge: 2021-05-21 | Disposition: A | Discharge status: Home / Self Care | Payer: BLUE CROSS/BLUE SHIELD | Attending: Emergency Medicine | Admitting: Emergency Medicine

## 2021-05-21 ENCOUNTER — Encounter (HOSPITAL_BASED_OUTPATIENT_CLINIC_OR_DEPARTMENT_OTHER): Payer: Self-pay | Admitting: *Deleted

## 2021-05-21 DIAGNOSIS — M549 Dorsalgia, unspecified: Secondary | ICD-10-CM | POA: Diagnosis present

## 2021-05-21 DIAGNOSIS — R0602 Shortness of breath: Secondary | ICD-10-CM | POA: Insufficient documentation

## 2021-05-21 DIAGNOSIS — J45909 Unspecified asthma, uncomplicated: Secondary | ICD-10-CM | POA: Diagnosis not present

## 2021-05-21 DIAGNOSIS — Z5321 Procedure and treatment not carried out due to patient leaving prior to being seen by health care provider: Secondary | ICD-10-CM | POA: Insufficient documentation

## 2021-05-21 NOTE — ED Notes (Signed)
Called for 3rd time; no answer; LWBS

## 2021-05-21 NOTE — ED Notes (Signed)
No answer for room x 1 

## 2021-05-21 NOTE — ED Notes (Signed)
RT assessed patient in triage. BBS clear, SAT 100%. Stated she took breathing treatment at home this AM due to coughing. No SOB or distress noted

## 2021-05-21 NOTE — ED Triage Notes (Signed)
C/o cont back pain MRI and CT done , referred to pain management, pt is form Out of town and requesting pain med refill.
